# Patient Record
Sex: Female | Born: 1975
Health system: Southern US, Community
[De-identification: ages and names within clinical notes are randomized; demographics above are authoritative.]

## PROBLEM LIST (undated history)

## (undated) DIAGNOSIS — F329 Major depressive disorder, single episode, unspecified: Secondary | ICD-10-CM

## (undated) DIAGNOSIS — F32A Depression, unspecified: Secondary | ICD-10-CM

## (undated) DIAGNOSIS — F419 Anxiety disorder, unspecified: Secondary | ICD-10-CM

## (undated) DIAGNOSIS — F410 Panic disorder [episodic paroxysmal anxiety] without agoraphobia: Secondary | ICD-10-CM

## (undated) HISTORY — PX: CHOLECYSTECTOMY: SHX55

## (undated) HISTORY — PX: TONSILLECTOMY: SUR1361

---

## 2008-10-27 ENCOUNTER — Emergency Department (HOSPITAL_COMMUNITY): Admission: EM | Admit: 2008-10-27 | Discharge: 2008-10-27 | Payer: Self-pay | Admitting: Family Medicine

## 2009-05-23 ENCOUNTER — Emergency Department (HOSPITAL_COMMUNITY): Admission: EM | Admit: 2009-05-23 | Discharge: 2009-05-23 | Payer: Self-pay | Admitting: Family Medicine

## 2010-07-23 ENCOUNTER — Emergency Department (HOSPITAL_COMMUNITY)
Admission: EM | Admit: 2010-07-23 | Discharge: 2010-07-23 | Disposition: A | Discharge status: Home / Self Care | Payer: Self-pay | Attending: Emergency Medicine | Admitting: Emergency Medicine

## 2010-07-23 DIAGNOSIS — K089 Disorder of teeth and supporting structures, unspecified: Secondary | ICD-10-CM | POA: Insufficient documentation

## 2010-08-08 LAB — POCT URINALYSIS DIP (DEVICE)
Bilirubin Urine: NEGATIVE
Nitrite: NEGATIVE
Protein, ur: NEGATIVE mg/dL
Urobilinogen, UA: 1 mg/dL (ref 0.0–1.0)
pH: 7 (ref 5.0–8.0)

## 2012-05-11 ENCOUNTER — Encounter (HOSPITAL_COMMUNITY): Payer: Self-pay | Admitting: Emergency Medicine

## 2012-05-11 ENCOUNTER — Emergency Department (INDEPENDENT_AMBULATORY_CARE_PROVIDER_SITE_OTHER)
Admission: EM | Admit: 2012-05-11 | Discharge: 2012-05-11 | Disposition: A | Discharge status: Home / Self Care | Payer: Worker's Compensation | Source: Home / Self Care | Attending: Emergency Medicine | Admitting: Emergency Medicine

## 2012-05-11 DIAGNOSIS — S76319A Strain of muscle, fascia and tendon of the posterior muscle group at thigh level, unspecified thigh, initial encounter: Secondary | ICD-10-CM

## 2012-05-11 DIAGNOSIS — IMO0002 Reserved for concepts with insufficient information to code with codable children: Secondary | ICD-10-CM

## 2012-05-11 MED ORDER — CYCLOBENZAPRINE HCL 10 MG PO TABS: 10.0000 mg | ORAL_TABLET | Freq: Three times a day (TID) | ORAL | Status: DC | PRN

## 2012-05-11 MED ORDER — HYDROCODONE-IBUPROFEN 7.5-200 MG PO TABS: 1.0000 | ORAL_TABLET | Freq: Three times a day (TID) | ORAL | Status: DC | PRN

## 2012-05-11 NOTE — ED Notes (Signed)
Pt c/o right leg pain. Pt states that she was pushing a clothes hamper to open up door but it stopped her suddenly. Pt states that later through out the day there was a gradual onset of pain in the right leg. Incident happened on 05/04/12. Pt has used ice,heat,and ibuprofen with no relief in pain. Pain is worse with standing and bending.

## 2012-05-11 NOTE — ED Notes (Signed)
Waiting discharge papers 

## 2012-05-11 NOTE — ED Provider Notes (Addendum)
History     CSN: [id number]  Arrival date & time 05/11/12  1621   First MD Initiated Contact with Patient 05/11/12 1622      Chief Complaint  Patient presents with  . Leg Pain    right leg pain. pt was pushing hamper into door but it did not open stopping her suddenly. gradual on set of pain throught the day.     (Consider location/radiation/quality/duration/timing/severity/associated sxs/prior treatment) HPI Comments: Patient presents urgent care this arriving pain on the posterior aspect of her right upper leg. She was pushing a clothes hamper to open a door when it suddenly stopped. She suddenly felt pain on the posterior aspect of her right leg. For the course of the week had been worsening. She has been applying both eyes heat and taking ibuprofen for pain. Pain continues and is exacerbated with standing and walking and when she bends her leg. Denies any bruising, denies any numbness or tingling sensations or lower extremities or weakness.  Patient is a 37 y.o. female presenting with leg pain. The history is provided by the patient.  Leg Pain  The incident occurred more than 2 days ago. The incident occurred at work. The pain is present in the right thigh. The pain is at a severity of 6/10. The pain is moderate. The pain has been constant since onset. Associated symptoms include inability to bear weight. Pertinent negatives include no numbness, no loss of motion, no muscle weakness, no loss of sensation and no tingling. She has tried nothing for the symptoms.    History reviewed. No pertinent past medical history.  Past Surgical History  Procedure Date  . Cholecystectomy   . Tonsillectomy     History reviewed. No pertinent family history.  History  Substance Use Topics  . Smoking status: Never Smoker   . Smokeless tobacco: Not on file  . Alcohol Use: No    OB History    Grav Para Term Preterm Abortions TAB SAB Ect Mult Living                  Review of Systems    Constitutional: Positive for activity change. Negative for fever, chills, diaphoresis, appetite change, fatigue and unexpected weight change.  Cardiovascular: Negative for chest pain.  Musculoskeletal: Negative for myalgias, back pain, joint swelling, arthralgias and gait problem.  Skin: Negative for rash and wound.  Neurological: Negative for tingling, facial asymmetry, weakness and numbness.    Allergies  Review of patient's allergies indicates no known allergies.  Home Medications   Current Outpatient Rx  Name  Route  Sig  Dispense  Refill  . CYCLOBENZAPRINE HCL 10 MG PO TABS   Oral   Take 1 tablet (10 mg total) by mouth 3 (three) times daily as needed for muscle spasms.   15 tablet   0   . HYDROCODONE-IBUPROFEN 7.5-200 MG PO TABS   Oral   Take 1 tablet by mouth every 8 (eight) hours as needed for pain.   15 tablet   0     BP 132/93  Pulse 94  Temp 98.7 F (37.1 C) (Oral)  Resp 20  SpO2 100%  LMP 04/26/2012  Physical Exam  Nursing note and vitals reviewed. Constitutional: Vital signs are normal. She appears well-nourished.  Non-toxic appearance. She does not have a sickly appearance. She does not appear ill. No distress.  Neck: Neck supple.  Musculoskeletal: She exhibits tenderness.       Right upper leg: She exhibits tenderness. She  exhibits no bony tenderness, no swelling, no edema, no deformity and no laceration.       Legs: Neurological: She is alert. She has normal strength. No sensory deficit. She exhibits normal muscle tone.  Skin: No abrasion, no bruising, no ecchymosis, no laceration, no petechiae and no rash noted. No erythema. No pallor.    ED Course  Procedures (including critical care time)  Labs Reviewed - No data to display No results found.   1. Hamstring muscle strain       MDM  Right hamstring sprain/strain. Incident occurred while working. Initially patient refused to followup with occupational health at her job environment.  Presented this evening to urgent care complaining of pain and requesting forms to be filled out. Patient was informed that we would medically attend to her symptoms but she needed to followup with occupational health for documentation purposes and followup. Patient was prescribed 15 tablets of Vicoprofen - along with a muscle relaxer and encouraged to continue applying ice packs. Was also provided with an Ace wrap to provide mild compression to strained area. Patient agrees with treatment plan and followup care. A Dr. Jamelle Haring was provided with restrictions for work for the next 72 hours.      Jimmie Molly, MD 05/11/12 1813  Jimmie Molly, MD 05/11/12 [phone]

## 2014-01-09 ENCOUNTER — Encounter (HOSPITAL_COMMUNITY): Payer: Self-pay | Admitting: Emergency Medicine

## 2014-01-09 ENCOUNTER — Emergency Department (INDEPENDENT_AMBULATORY_CARE_PROVIDER_SITE_OTHER)
Admission: EM | Admit: 2014-01-09 | Discharge: 2014-01-09 | Disposition: A | Discharge status: Home / Self Care | Payer: 59 | Source: Home / Self Care | Attending: Emergency Medicine | Admitting: Emergency Medicine

## 2014-01-09 DIAGNOSIS — R300 Dysuria: Secondary | ICD-10-CM

## 2014-01-09 DIAGNOSIS — N390 Urinary tract infection, site not specified: Secondary | ICD-10-CM

## 2014-01-09 LAB — POCT URINALYSIS DIP (DEVICE)
Bilirubin Urine: NEGATIVE
Glucose, UA: NEGATIVE mg/dL
Ketones, ur: NEGATIVE mg/dL
LEUKOCYTES UA: NEGATIVE
NITRITE: NEGATIVE
PROTEIN: NEGATIVE mg/dL
Specific Gravity, Urine: >=1.03 (ref 1.005–1.030)
UROBILINOGEN UA: 0.2 mg/dL (ref 0.0–1.0)
pH: 5.5 (ref 5.0–8.0)

## 2014-01-09 MED ORDER — NITROFURANTOIN MONOHYD MACRO 100 MG PO CAPS: 100.0000 mg | ORAL_CAPSULE | Freq: Two times a day (BID) | ORAL | Status: DC

## 2014-01-09 NOTE — ED Provider Notes (Signed)
Medical screening examination/treatment/procedure(s) were performed by non-physician practitioner and as supervising physician I was immediately available for consultation/collaboration.  Leslee Home, M.D.  Reuben Likes, MD 01/09/14 2139

## 2014-01-09 NOTE — Discharge Instructions (Signed)
Urinary Tract Infection A urinary tract infection (UTI) can occur any place along the urinary tract. The tract includes the kidneys, ureters, bladder, and urethra. A type of germ called bacteria often causes a UTI. UTIs are often helped with antibiotic medicine.  HOME CARE   If given, take antibiotics as told by your doctor. Finish them even if you start to feel better.  Drink enough fluids to keep your pee (urine) clear or pale yellow.  Avoid tea, drinks with caffeine, and bubbly (carbonated) drinks.  Pee often. Avoid holding your pee in for a long time.  Pee before and after having sex (intercourse).  Wipe from front to back after you poop (bowel movement) if you are a woman. Use each tissue only once. GET HELP RIGHT AWAY IF:   You have back pain.  You have lower belly (abdominal) pain.  You have chills.  You feel sick to your stomach (nauseous).  You throw up (vomit).  Your burning or discomfort with peeing does not go away.  You have a fever.  Your symptoms are not better in 3 days. MAKE SURE YOU:   Understand these instructions.  Will watch your condition.  Will get help right away if you are not doing well or get worse. Document Released: 10/04/2007 Document Revised: 01/10/2012 Document Reviewed: 11/16/2011 [facility] Patient Information 2015 [place], [place]. This information is not intended to replace advice given to you by your health care provider. Make sure you discuss any questions you have with your health care provider.   Lots of water. Empty bladder post intercourse. AZO early can help with symptoms. F/U if worsens.

## 2014-01-09 NOTE — ED Provider Notes (Signed)
CSN: [id number]     Arrival date & time 01/09/14  1837 History   First MD Initiated Contact with Patient 01/09/14 1903     Chief Complaint  Patient presents with  . Urinary Tract Infection   (Consider location/radiation/quality/duration/timing/severity/associated sxs/prior Treatment) HPI Comments: Patient presents with dysuria, frequency and pressure for 24-36 hours. Prior UTI's in the past and this feels the same. She is in a monogamous relationship, no vaginal discharge. No missed menses. No abdominal or pelvic pain. No fever or chills. Recent PAP and STD check at GYN in last 4 months; all normal  Patient is a 38 y.o. female presenting with urinary tract infection. The history is provided by the patient.  Urinary Tract Infection    History reviewed. No pertinent past medical history. Past Surgical History  Procedure Laterality Date  . Cholecystectomy    . Tonsillectomy     No family history on file. History  Substance Use Topics  . Smoking status: Never Smoker   . Smokeless tobacco: Not on file  . Alcohol Use: No   OB History   Grav Para Term Preterm Abortions TAB SAB Ect Mult Living                 Review of Systems  All other systems reviewed and are negative.   Allergies  Review of patient's allergies indicates no known allergies.  Home Medications   Prior to Admission medications   Medication Sig Start Date End Date Taking? Authorizing Provider  cyclobenzaprine (FLEXERIL) 10 MG tablet Take 1 tablet (10 mg total) by mouth 3 (three) times daily as needed for muscle spasms. 05/11/12   Jimmie Molly, MD  HYDROcodone-ibuprofen (VICOPROFEN) 7.5-200 MG per tablet Take 1 tablet by mouth every 8 (eight) hours as needed for pain. 05/11/12   Jimmie Molly, MD  nitrofurantoin, macrocrystal-monohydrate, (MACROBID) 100 MG capsule Take 1 capsule (100 mg total) by mouth 2 (two) times daily. 01/09/14   Riki Sheer, PA-C   BP 138/83  Pulse 95  Temp(Src) 98.8 F (37.1 C) (Oral)   Resp 16  SpO2 97% Physical Exam  Nursing note and vitals reviewed. Constitutional: She is oriented to person, place, and time. She appears well-developed and well-nourished. No distress.  HENT:  Head: Normocephalic and atraumatic.  Abdominal: Soft. She exhibits no distension and no mass. There is no tenderness. There is no rebound and no guarding.  Neurological: She is alert and oriented to person, place, and time.  Skin: Skin is warm and dry. She is not diaphoretic.  Psychiatric: Her behavior is normal.    ED Course  Procedures (including critical care time) Labs Review Labs Reviewed  POCT URINALYSIS DIP (DEVICE) - Abnormal; Notable for the following:    Hgb urine dipstick MODERATE (*)    All other components within normal limits    Imaging Review No results found.   MDM   1. UTI (lower urinary tract infection)   2. Dysuria    Treat symptoms as appears to be UTI. Noted hematuria. Cover with Macrobid. F/U if worsens.     Riki Sheer, PA-C 01/09/14 1928

## 2014-01-09 NOTE — ED Notes (Signed)
Patient c/o frquent urination and urgency to urinate onset today. Patient reports she does hold her urine and wear thongs and drink a lot of soda but she doesn't know if that attributes to the symptoms. Patient denies fever. Patient is alert and oriented and in NAD.

## 2014-04-08 ENCOUNTER — Emergency Department (INDEPENDENT_AMBULATORY_CARE_PROVIDER_SITE_OTHER)
Admission: EM | Admit: 2014-04-08 | Discharge: 2014-04-08 | Disposition: A | Discharge status: Home / Self Care | Payer: 59 | Source: Home / Self Care | Attending: Family Medicine | Admitting: Family Medicine

## 2014-04-08 ENCOUNTER — Encounter (HOSPITAL_COMMUNITY): Payer: Self-pay | Admitting: *Deleted

## 2014-04-08 DIAGNOSIS — J40 Bronchitis, not specified as acute or chronic: Secondary | ICD-10-CM

## 2014-04-08 MED ORDER — AZITHROMYCIN 250 MG PO TABS: ORAL_TABLET | ORAL | Status: DC

## 2014-04-08 MED ORDER — HYDROCOD POLST-CHLORPHEN POLST 10-8 MG/5ML PO LQCR: 5.0000 mL | Freq: Two times a day (BID) | ORAL | Status: DC | PRN

## 2014-04-08 MED ORDER — ALBUTEROL SULFATE HFA 108 (90 BASE) MCG/ACT IN AERS: 2.0000 | INHALATION_SPRAY | RESPIRATORY_TRACT | Status: DC | PRN

## 2014-04-08 NOTE — ED Provider Notes (Signed)
CSN: [id number]     Arrival date & time 04/08/14  1853 History   First MD Initiated Contact with Patient 04/08/14 1942     No chief complaint on file.  (Consider location/radiation/quality/duration/timing/severity/associated sxs/prior Treatment) HPI             38 year old female presents complaining of a sinus infection or bronchitis. She has been sick for about 10 days. She has cough, congestion, sinus pressure, sore throat. She also feels slightly short of breath. Her symptoms have been constantly worsening since they began. She denies fever, chills, NVD, or abdominal pain. No recent travel or sick contacts.  No past medical history on file. Past Surgical History  Procedure Laterality Date  . Cholecystectomy    . Tonsillectomy     No family history on file. History  Substance Use Topics  . Smoking status: Never Smoker   . Smokeless tobacco: Not on file  . Alcohol Use: No   OB History    No data available     Review of Systems  Constitutional: Negative for fever and chills.  HENT: Positive for congestion, ear pain, postnasal drip, rhinorrhea, sinus pressure and sore throat.   Respiratory: Positive for cough. Negative for shortness of breath.   Cardiovascular: Negative for chest pain.  Gastrointestinal: Negative for nausea, vomiting, abdominal pain and diarrhea.  All other systems reviewed and are negative.   Allergies  Review of patient's allergies indicates no known allergies.  Home Medications   Prior to Admission medications   Medication Sig Start Date End Date Taking? Authorizing Provider  albuterol (PROVENTIL HFA;VENTOLIN HFA) 108 (90 BASE) MCG/ACT inhaler Inhale 2 puffs into the lungs every 4 (four) hours as needed for wheezing. 04/08/14   Graylon GoodZachary H Lyann Hagstrom, PA-C  azithromycin (ZITHROMAX Z-PAK) 250 MG tablet Use as directed 04/08/14   Graylon GoodZachary H Shameca Landen, PA-C  chlorpheniramine-HYDROcodone (TUSSIONEX PENNKINETIC ER) 10-8 MG/5ML LQCR Take 5 mLs by mouth every 12 (twelve)  hours as needed for cough. 04/08/14   Adrian BlackwaterZachary H Brystol Wasilewski, PA-C  cyclobenzaprine (FLEXERIL) 10 MG tablet Take 1 tablet (10 mg total) by mouth 3 (three) times daily as needed for muscle spasms. 05/11/12   Jimmie MollyPaolo Coll, MD  HYDROcodone-ibuprofen (VICOPROFEN) 7.5-200 MG per tablet Take 1 tablet by mouth every 8 (eight) hours as needed for pain. 05/11/12   Jimmie MollyPaolo Coll, MD  nitrofurantoin, macrocrystal-monohydrate, (MACROBID) 100 MG capsule Take 1 capsule (100 mg total) by mouth 2 (two) times daily. 01/09/14   Riki SheerMichelle G Young, PA-C   BP 133/65 mmHg  Pulse 90  Temp(Src) 98.1 F (36.7 C) (Oral)  Resp 16  SpO2 98% Physical Exam  Constitutional: She is oriented to person, place, and time. Vital signs are normal. She appears well-developed and well-nourished. No distress.  HENT:  Head: Normocephalic and atraumatic.  Right Ear: External ear normal.  Left Ear: External ear normal.  Nose: Nose normal.  Mouth/Throat: Oropharynx is clear and moist. No oropharyngeal exudate.  Eyes: Conjunctivae are normal. Right eye exhibits no discharge. Left eye exhibits no discharge.  Neck: Normal range of motion. Neck supple.  Cardiovascular: Normal rate, regular rhythm and normal heart sounds.   Pulmonary/Chest: Effort normal. No respiratory distress. She has wheezes (mild expiratory, bilateral upper lung fields). She has no rales.  Lymphadenopathy:    She has no cervical adenopathy.  Neurological: She is alert and oriented to person, place, and time. She has normal strength. Coordination normal.  Skin: Skin is warm and dry. No rash noted. She is not diaphoretic.  Psychiatric: She has a normal mood and affect. Judgment normal.  Nursing note and vitals reviewed.   ED Course  Procedures (including critical care time) Labs Review Labs Reviewed - No data to display  Imaging Review No results found.   MDM   1. Bronchitis    Bronchitis, so worsening after 10 days, will treat with azithromycin in addition to  symptomatic management with cough suppressant and albuterol. Follow-up if no improvement in a few days.   Meds ordered this encounter  Medications  . azithromycin (ZITHROMAX Z-PAK) 250 MG tablet    Sig: Use as directed    Dispense:  6 each    Refill:  0    Order Specific Question:  Supervising Provider    Answer:  Linna HoffKINDL, JAMES D [phone][[id number]]  . chlorpheniramine-HYDROcodone (TUSSIONEX PENNKINETIC ER) 10-8 MG/5ML LQCR    Sig: Take 5 mLs by mouth every 12 (twelve) hours as needed for cough.    Dispense:  115 mL    Refill:  0    Order Specific Question:  Supervising Provider    Answer:  Linna HoffKINDL, JAMES D [phone][[id number]]  . albuterol (PROVENTIL HFA;VENTOLIN HFA) 108 (90 BASE) MCG/ACT inhaler    Sig: Inhale 2 puffs into the lungs every 4 (four) hours as needed for wheezing.    Dispense:  1 Inhaler    Refill:  0    Order Specific Question:  Supervising Provider    Answer:  Bradd CanaryKINDL, JAMES D [[id number]]   \    Graylon GoodZachary H Jeriah Corkum, PA-C 04/08/14 2011

## 2014-04-08 NOTE — ED Notes (Signed)
C/o coughing up mucous, pressure in her sinuses, and sore throat onset 7-10 days ago..  No fever or earache.

## 2014-04-08 NOTE — Discharge Instructions (Signed)
Metered Dose Inhaler (No Spacer Used) Inhaled medicines are the basis of treatment for asthma and other breathing problems. Inhaled medicine can only be effective if used properly. Good technique assures that the medicine reaches the lungs. Metered dose inhalers (MDIs) are used to deliver a variety of inhaled medicines. These include quick relief or rescue medicines (such as bronchodilators) and controller medicines (such as corticosteroids). The medicine is delivered by pushing down on a metal canister to release a set amount of spray. If you are using different kinds of inhalers, use your quick relief medicine to open the airways 10-15 minutes before using a steroid, if instructed to do so by your health care provider. If you are unsure which inhalers to use and the order of using them, ask your health care provider, nurse, or respiratory therapist. HOW TO USE THE INHALER  Remove the cap from the inhaler.  If you are using the inhaler for the first time, you will need to prime it. Shake the inhaler for 5 seconds and release four puffs into the air, away from your face. Ask your health care provider or pharmacist if you have questions about priming your inhaler.  Shake the inhaler for 5 seconds before each breath in (inhalation).  Position the inhaler so that the top of the canister faces up.  Put your index finger on the top of the medicine canister. Your thumb supports the bottom of the inhaler.  Open your mouth.  Either place the inhaler between your teeth and place your lips tightly around the mouthpiece, or hold the inhaler 1-2 inches away from your open mouth. If you are unsure of which technique to use, ask your health care provider.  Breathe out (exhale) normally and as completely as possible.  Press the canister down with the index finger to release the medicine.  At the same time as the canister is pressed, inhale deeply and slowly until your lungs are completely filled. This  should take 4-6 seconds. Keep your tongue down.  Hold the medicine in your lungs for 5-10 seconds (10 seconds is best). This helps the medicine get into the small airways of your lungs.  Breathe out slowly, through pursed lips. Whistling is an example of pursed lips.  Wait at least 1 minute between puffs. Continue with the above steps until you have taken the number of puffs your health care provider has ordered. Do not use the inhaler more than your health care provider directs you to.  Replace the cap on the inhaler.  Follow the directions from your health care provider or the inhaler insert for cleaning the inhaler. If you are using a steroid inhaler, after your last puff, rinse your mouth with water, gargle, and spit out the water. Do not swallow the water. AVOID:  Inhaling before or after starting the spray of medicine. It takes practice to coordinate your breathing with triggering the spray.  Inhaling through the nose (rather than the mouth) when triggering the spray. HOW TO DETERMINE IF YOUR INHALER IS FULL OR NEARLY EMPTY You cannot know when an inhaler is empty by shaking it. Some inhalers are now being made with dose counters. Ask your health care provider for a prescription that has a dose counter if you feel you need that extra help. If your inhaler does not have a counter, ask your health care provider to help you determine the date you need to refill your inhaler. Write the refill date on a calendar or your inhaler canister. Refill  your inhaler 7-10 days before it runs out. Be sure to keep an adequate supply of medicine. This includes making sure it has not expired, and making sure you have a spare inhaler. SEEK MEDICAL CARE IF:  Symptoms are only partially relieved with your inhaler.  You are having trouble using your inhaler.  You experience an increase in phlegm. SEEK IMMEDIATE MEDICAL CARE IF:  You feel little or no relief with your inhalers. You are still wheezing and  feeling shortness of breath, tightness in your chest, or both.  You have dizziness, headaches, or a fast heart rate.  You have chills, fever, or night sweats.  There is a noticeable increase in phlegm production, or there is blood in the phlegm. MAKE SURE YOU:  Understand these instructions.  Will watch your condition.  Will get help right away if you are not doing well or get worse. Document Released: 02/12/2007 Document Revised: 09/01/2013 Document Reviewed: 10/03/2012 [facility] Patient Information 2015 [place], [place]. This information is not intended to replace advice given to you by your health care provider. Make sure you discuss any questions you have with your health care provider.  Upper Respiratory Infection, Adult An upper respiratory infection (URI) is also sometimes known as the common cold. The upper respiratory tract includes the nose, sinuses, throat, trachea, and bronchi. Bronchi are the airways leading to the lungs. Most people improve within 1 week, but symptoms can last up to 2 weeks. A residual cough may last even longer.  CAUSES Many different viruses can infect the tissues lining the upper respiratory tract. The tissues become irritated and inflamed and often become very moist. Mucus production is also common. A cold is contagious. You can easily spread the virus to others by oral contact. This includes kissing, sharing a glass, coughing, or sneezing. Touching your mouth or nose and then touching a surface, which is then touched by another person, can also spread the virus. SYMPTOMS  Symptoms typically develop 1 to 3 days after you come in contact with a cold virus. Symptoms vary from person to person. They may include:  Runny nose.  Sneezing.  Nasal congestion.  Sinus irritation.  Sore throat.  Loss of voice (laryngitis).  Cough.  Fatigue.  Muscle aches.  Loss of appetite.  Headache.  Low-grade fever. DIAGNOSIS  You might diagnose your own cold  based on familiar symptoms, since most people get a cold 2 to 3 times a year. Your caregiver can confirm this based on your exam. Most importantly, your caregiver can check that your symptoms are not due to another disease such as strep throat, sinusitis, pneumonia, asthma, or epiglottitis. Blood tests, throat tests, and X-rays are not necessary to diagnose a common cold, but they may sometimes be helpful in excluding other more serious diseases. Your caregiver will decide if any further tests are required. RISKS AND COMPLICATIONS  You may be at risk for a more severe case of the common cold if you smoke cigarettes, have chronic heart disease (such as heart failure) or lung disease (such as asthma), or if you have a weakened immune system. The very young and very old are also at risk for more serious infections. Bacterial sinusitis, middle ear infections, and bacterial pneumonia can complicate the common cold. The common cold can worsen asthma and chronic obstructive pulmonary disease (COPD). Sometimes, these complications can require emergency medical care and may be life-threatening. PREVENTION  The best way to protect against getting a cold is to practice good hygiene. Avoid  oral or hand contact with people with cold symptoms. Wash your hands often if contact occurs. There is no clear evidence that vitamin C, vitamin E, echinacea, or exercise reduces the chance of developing a cold. However, it is always recommended to get plenty of rest and practice good nutrition. TREATMENT  Treatment is directed at relieving symptoms. There is no cure. Antibiotics are not effective, because the infection is caused by a virus, not by bacteria. Treatment may include:  Increased fluid intake. Sports drinks offer valuable electrolytes, sugars, and fluids.  Breathing heated mist or steam (vaporizer or shower).  Eating chicken soup or other clear broths, and maintaining good nutrition.  Getting plenty of rest.  Using  gargles or lozenges for comfort.  Controlling fevers with ibuprofen or acetaminophen as directed by your caregiver.  Increasing usage of your inhaler if you have asthma. Zinc gel and zinc lozenges, taken in the first 24 hours of the common cold, can shorten the duration and lessen the severity of symptoms. Pain medicines may help with fever, muscle aches, and throat pain. A variety of non-prescription medicines are available to treat congestion and runny nose. Your caregiver can make recommendations and may suggest nasal or lung inhalers for other symptoms.  HOME CARE INSTRUCTIONS   Only take over-the-counter or prescription medicines for pain, discomfort, or fever as directed by your caregiver.  Use a warm mist humidifier or inhale steam from a shower to increase air moisture. This may keep secretions moist and make it easier to breathe.  Drink enough water and fluids to keep your urine clear or pale yellow.  Rest as needed.  Return to work when your temperature has returned to normal or as your caregiver advises. You may need to stay home longer to avoid infecting others. You can also use a face mask and careful hand washing to prevent spread of the virus. SEEK MEDICAL CARE IF:   After the first few days, you feel you are getting worse rather than better.  You need your caregiver's advice about medicines to control symptoms.  You develop chills, worsening shortness of breath, or brown or red sputum. These may be signs of pneumonia.  You develop yellow or brown nasal discharge or pain in the face, especially when you bend forward. These may be signs of sinusitis.  You develop a fever, swollen neck glands, pain with swallowing, or white areas in the back of your throat. These may be signs of strep throat. SEEK IMMEDIATE MEDICAL CARE IF:   You have a fever.  You develop severe or persistent headache, ear pain, sinus pain, or chest pain.  You develop wheezing, a prolonged cough, cough  up blood, or have a change in your usual mucus (if you have chronic lung disease).  You develop sore muscles or a stiff neck. Document Released: 10/11/2000 Document Revised: 07/10/2011 Document Reviewed: 07/23/2013 [facility] Patient Information 2015 [place]Exit[place], [place]. This information is not intended to replace advice given to you by your health care provider. Make sure you discuss any questions you have with your health care provider.

## 2014-11-06 ENCOUNTER — Emergency Department (INDEPENDENT_AMBULATORY_CARE_PROVIDER_SITE_OTHER)
Admission: EM | Admit: 2014-11-06 | Discharge: 2014-11-06 | Disposition: A | Discharge status: Home / Self Care | Payer: 59 | Source: Home / Self Care

## 2014-11-06 ENCOUNTER — Encounter (HOSPITAL_COMMUNITY): Payer: Self-pay

## 2014-11-06 DIAGNOSIS — L237 Allergic contact dermatitis due to plants, except food: Secondary | ICD-10-CM

## 2014-11-06 MED ORDER — PREDNISONE 20 MG PO TABS: ORAL_TABLET | ORAL | Status: DC

## 2014-11-06 NOTE — ED Notes (Signed)
Rash on skin. ?poison ivy?

## 2014-11-06 NOTE — Discharge Instructions (Signed)

## 2014-11-06 NOTE — ED Provider Notes (Signed)
CSN: [id number]     Arrival date & time 11/06/14  1800 History   First MD Initiated Contact with Patient 11/06/14 1817     No chief complaint on file.  (Consider location/radiation/quality/duration/timing/severity/associated sxs/prior Treatment) Patient is a 39 y.o. female presenting with poison ivy. The history is provided by the patient.  Poison Kristin Guzman This is a new problem. The current episode started more than 1 week ago. The problem occurs constantly. The problem has been gradually worsening. Nothing aggravates the symptoms. The symptoms are relieved by medications. She has tried a cold compress and a warm compress for the symptoms. The treatment provided no relief.   Was weeding in yard one week ago and developed pruritc rash on arms, upper chest, and legs No past medical history on file. Past Surgical History  Procedure Laterality Date  . Cholecystectomy    . Tonsillectomy     No family history on file. History  Substance Use Topics  . Smoking status: Never Smoker   . Smokeless tobacco: Not on file  . Alcohol Use: Yes     Comment: social   OB History    No data available     Review of Systems  Constitutional: Negative.   HENT: Negative.   Eyes: Negative.   Respiratory: Negative.   Cardiovascular: Negative.   Gastrointestinal: Negative.   Skin: Positive for rash.  Neurological: Negative.   Psychiatric/Behavioral: Negative.     Allergies  Review of patient's allergies indicates no known allergies.  Home Medications   Prior to Admission medications   Medication Sig Start Date End Date Taking? Authorizing Provider  albuterol (PROVENTIL HFA;VENTOLIN HFA) 108 (90 BASE) MCG/ACT inhaler Inhale 2 puffs into the lungs every 4 (four) hours as needed for wheezing. 04/08/14   Graylon GoodZachary H Baker, PA-C  azithromycin (ZITHROMAX Z-PAK) 250 MG tablet Use as directed 04/08/14   Graylon GoodZachary H Baker, PA-C  chlorpheniramine-HYDROcodone (TUSSIONEX PENNKINETIC ER) 10-8 MG/5ML LQCR Take 5 mLs by  mouth every 12 (twelve) hours as needed for cough. 04/08/14   Adrian BlackwaterZachary H Baker, PA-C  cyclobenzaprine (FLEXERIL) 10 MG tablet Take 1 tablet (10 mg total) by mouth 3 (three) times daily as needed for muscle spasms. 05/11/12   Jimmie MollyPaolo Coll, MD  HYDROcodone-ibuprofen (VICOPROFEN) 7.5-200 MG per tablet Take 1 tablet by mouth every 8 (eight) hours as needed for pain. 05/11/12   Jimmie MollyPaolo Coll, MD  nitrofurantoin, macrocrystal-monohydrate, (MACROBID) 100 MG capsule Take 1 capsule (100 mg total) by mouth 2 (two) times daily. 01/09/14   Riki SheerMichelle G Young, PA-C   BP 127/85 mmHg  Pulse 93  Temp(Src) 98.5 F (36.9 C) (Oral)  Resp 16  SpO2 100% Physical Exam  Constitutional: She appears well-developed and well-nourished.  Skin: Rash noted.  Psychiatric:  Linear streaks on left forearm, papules on upper chest No hand web space lesions. No honey crusting    ED Course  Procedures (including critical care time) Labs Review Labs Reviewed - No data to display  Imaging Review No results found.   MDM  Most likely diffuse poison ivy.  Will use prednisone.  [place] to [place] off there.  Camelia EngKurt Sydney Hasten,MD    Chi Garlow, MD 11/06/14 [phone]

## 2015-01-10 ENCOUNTER — Encounter (HOSPITAL_BASED_OUTPATIENT_CLINIC_OR_DEPARTMENT_OTHER): Payer: Self-pay | Admitting: Emergency Medicine

## 2015-01-10 ENCOUNTER — Emergency Department (HOSPITAL_BASED_OUTPATIENT_CLINIC_OR_DEPARTMENT_OTHER)
Admission: EM | Admit: 2015-01-10 | Discharge: 2015-01-10 | Disposition: A | Discharge status: Home / Self Care | Payer: 59 | Attending: Emergency Medicine | Admitting: Emergency Medicine

## 2015-01-10 DIAGNOSIS — Z79899 Other long term (current) drug therapy: Secondary | ICD-10-CM | POA: Insufficient documentation

## 2015-01-10 DIAGNOSIS — K029 Dental caries, unspecified: Secondary | ICD-10-CM

## 2015-01-10 DIAGNOSIS — K0261 Dental caries on smooth surface limited to enamel: Secondary | ICD-10-CM | POA: Diagnosis not present

## 2015-01-10 DIAGNOSIS — K002 Abnormalities of size and form of teeth: Secondary | ICD-10-CM | POA: Diagnosis not present

## 2015-01-10 DIAGNOSIS — K088 Other specified disorders of teeth and supporting structures: Secondary | ICD-10-CM | POA: Diagnosis present

## 2015-01-10 MED ORDER — MELOXICAM 15 MG PO TABS: 15.0000 mg | ORAL_TABLET | Freq: Every day | ORAL | Status: DC

## 2015-01-10 MED ORDER — PENICILLIN V POTASSIUM 500 MG PO TABS: 500.0000 mg | ORAL_TABLET | Freq: Four times a day (QID) | ORAL | Status: AC

## 2015-01-10 MED ORDER — PENICILLIN V POTASSIUM 250 MG PO TABS
500.0000 mg | ORAL_TABLET | Freq: Once | ORAL | Status: AC
  Administered 2015-01-10: 500 mg via ORAL
  Filled 2015-01-10: qty 2

## 2015-01-10 NOTE — ED Provider Notes (Signed)
CSN: [id number]     Arrival date & time 01/10/15  0116 History   First MD Initiated Contact with Patient 01/10/15 0319     Chief Complaint  Patient presents with  . Dental Pain     (Consider location/radiation/quality/duration/timing/severity/associated sxs/prior Treatment) Patient is a 39 y.o. female presenting with tooth pain. The history is provided by the patient.  Dental Pain Location:  Lower Lower teeth location:  19/LL 1st molar Quality:  Aching Severity:  Severe Onset quality:  Gradual Timing:  Constant Progression:  Unchanged Chronicity:  Recurrent Context: dental caries and poor dentition   Previous work-up:  Filled cavity and root canal Relieved by:  Nothing Worsened by:  Nothing tried Ineffective treatments:  None tried Associated symptoms: no drooling, no facial swelling, no fever, no neck swelling and no trismus     History reviewed. No pertinent past medical history. Past Surgical History  Procedure Laterality Date  . Cholecystectomy    . Tonsillectomy     History reviewed. No pertinent family history. Social History  Substance Use Topics  . Smoking status: Never Smoker   . Smokeless tobacco: None  . Alcohol Use: Yes     Comment: social   OB History    No data available     Review of Systems  Constitutional: Negative for fever.  HENT: Negative for drooling, facial swelling and trouble swallowing.   All other systems reviewed and are negative.     Allergies  Review of patient's allergies indicates no known allergies.  Home Medications   Prior to Admission medications   Medication Sig Start Date End Date Taking? Authorizing Provider  albuterol (PROVENTIL HFA;VENTOLIN HFA) 108 (90 BASE) MCG/ACT inhaler Inhale 2 puffs into the lungs every 4 (four) hours as needed for wheezing. 04/08/14   Graylon Good, PA-C  azithromycin (ZITHROMAX Z-PAK) 250 MG tablet Use as directed 04/08/14   Graylon Good, PA-C  chlorpheniramine-HYDROcodone (TUSSIONEX  PENNKINETIC ER) 10-8 MG/5ML LQCR Take 5 mLs by mouth every 12 (twelve) hours as needed for cough. 04/08/14   Adrian Blackwater Baker, PA-C  cyclobenzaprine (FLEXERIL) 10 MG tablet Take 1 tablet (10 mg total) by mouth 3 (three) times daily as needed for muscle spasms. 05/11/12   Jimmie Molly, MD  HYDROcodone-ibuprofen (VICOPROFEN) 7.5-200 MG per tablet Take 1 tablet by mouth every 8 (eight) hours as needed for pain. 05/11/12   Jimmie Molly, MD  meloxicam (MOBIC) 15 MG tablet Take 1 tablet (15 mg total) by mouth daily. 01/10/15   Jahkai Yandell, MD  nitrofurantoin, macrocrystal-monohydrate, (MACROBID) 100 MG capsule Take 1 capsule (100 mg total) by mouth 2 (two) times daily. 01/09/14   Riki Sheer, PA-C  penicillin v potassium (VEETID) 500 MG tablet Take 1 tablet (500 mg total) by mouth 4 (four) times daily. 01/10/15 01/17/15  Findlay Dagher, MD  predniSONE (DELTASONE) 20 MG tablet Two daily with food 11/06/14   Elvina Sidle, MD  predniSONE (DELTASONE) 20 MG tablet Two daily with food 11/06/14   Elvina Sidle, MD   BP 146/84 mmHg  Pulse 79  Temp(Src) 98.7 F (37.1 C) (Oral)  Resp 16  Ht 4\' 11"  (1.499 m)  Wt 135 lb (61.236 kg)  BMI 27.25 kg/m2  SpO2 98%  LMP 01/03/2015 Physical Exam  Constitutional: She is oriented to person, place, and time. She appears well-developed and well-nourished. No distress.  HENT:  Head: Normocephalic and atraumatic.  Mouth/Throat: Oropharynx is clear and moist. No trismus in the jaw. Abnormal dentition. Dental caries present.  No uvula swelling.  Eyes: Conjunctivae are normal. Pupils are equal, round, and reactive to light.  Neck: Normal range of motion. Neck supple.  Cardiovascular: Normal rate and regular rhythm.   Pulmonary/Chest: Effort normal and breath sounds normal. She has no wheezes. She has no rales.  Abdominal: Soft. Bowel sounds are normal. There is no tenderness. There is no rebound and no guarding.  Musculoskeletal: Normal range of motion.  Lymphadenopathy:     She has no cervical adenopathy.  Neurological: She is alert and oriented to person, place, and time.  Skin: Skin is warm and dry.  Psychiatric: She has a normal mood and affect.    ED Course  Procedures (including critical care time) Labs Review Labs Reviewed - No data to display  Imaging Review No results found. I have personally reviewed and evaluated these images and lab results as part of my medical decision-making.   EKG Interpretation None      MDM   Final diagnoses:  Dental caries associated with enamel hypomineralization      Medication List    TAKE these medications        meloxicam 15 MG tablet  Commonly known as:  MOBIC  Take 1 tablet (15 mg total) by mouth daily.     penicillin v potassium 500 MG tablet  Commonly known as:  VEETID  Take 1 tablet (500 mg total) by mouth 4 (four) times daily.      ASK your doctor about these medications        albuterol 108 (90 BASE) MCG/ACT inhaler  Commonly known as:  PROVENTIL HFA;VENTOLIN HFA  Inhale 2 puffs into the lungs every 4 (four) hours as needed for wheezing.     azithromycin 250 MG tablet  Commonly known as:  ZITHROMAX Z-PAK  Use as directed     chlorpheniramine-HYDROcodone 10-8 MG/5ML Lqcr  Commonly known as:  TUSSIONEX PENNKINETIC ER  Take 5 mLs by mouth every 12 (twelve) hours as needed for cough.     cyclobenzaprine 10 MG tablet  Commonly known as:  FLEXERIL  Take 1 tablet (10 mg total) by mouth 3 (three) times daily as needed for muscle spasms.     HYDROcodone-ibuprofen 7.5-200 MG per tablet  Commonly known as:  VICOPROFEN  Take 1 tablet by mouth every 8 (eight) hours as needed for pain.     nitrofurantoin (macrocrystal-monohydrate) 100 MG capsule  Commonly known as:  MACROBID  Take 1 capsule (100 mg total) by mouth 2 (two) times daily.     predniSONE 20 MG tablet  Commonly known as:  DELTASONE  Two daily with food     predniSONE 20 MG tablet  Commonly known as:  DELTASONE  Two daily  with food       Referral to dentistry and pen-VK and mobic.  Strict return precautions    Karah Caruthers, MD 01/10/15 0730

## 2015-01-10 NOTE — ED Notes (Signed)
Pt c/o dental pain in left lower molar beginning yesterday. Reports taking Tylenol at home with no relief. States "the tooth is dead".

## 2015-01-10 NOTE — ED Notes (Signed)
Patient reports that she thinks she has an abscess tooth on the left side of her mouth

## 2015-01-10 NOTE — Discharge Instructions (Signed)
Dental Care and Dentist Visits  Dental care supports good overall health. Regular dental visits can also help you avoid dental pain, bleeding, infection, and other more serious health problems in the future. It is important to keep the mouth healthy because diseases in the teeth, gums, and other oral tissues can spread to other areas of the body. Some problems, such as diabetes, heart disease, and pre-term labor have been associated with poor oral health.   See your dentist every 6 months. If you experience emergency problems such as a toothache or broken tooth, go to the dentist right away. If you see your dentist regularly, you may catch problems early. It is easier to be treated for problems in the early stages.   WHAT TO EXPECT AT A DENTIST VISIT   Your dentist will look for many common oral health problems and recommend proper treatment. At your regular dental visit, you can expect:  · Gentle cleaning of the teeth and gums. This includes scraping and polishing. This helps to remove the sticky substance around the teeth and gums (plaque). Plaque forms in the mouth shortly after eating. Over time, plaque hardens on the teeth as tartar. If tartar is not removed regularly, it can cause problems. Cleaning also helps remove stains.  · Periodic X-rays. These pictures of the teeth and supporting bone will help your dentist assess the health of your teeth.  · Periodic fluoride treatments. Fluoride is a natural mineral shown to help strengthen teeth. Fluoride treatment involves applying a fluoride gel or varnish to the teeth. It is most commonly done in children.  · Examination of the mouth, tongue, jaws, teeth, and gums to look for any oral health problems, such as:  ¨ Cavities (dental caries). This is decay on the tooth caused by plaque, sugar, and acid in the mouth. It is best to catch a cavity when it is small.  ¨ Inflammation of the gums caused by plaque buildup (gingivitis).  ¨ Problems with the mouth or malformed  or misaligned teeth.  ¨ Oral cancer or other diseases of the soft tissues or jaws.   KEEP YOUR TEETH AND GUMS HEALTHY  For healthy teeth and gums, follow these general guidelines as well as your dentist's specific advice:  · Have your teeth professionally cleaned at the dentist every 6 months.  · Brush twice daily with a fluoride toothpaste.  · Floss your teeth daily.   · Ask your dentist if you need fluoride supplements, treatments, or fluoride toothpaste.  · Eat a healthy diet. Reduce foods and drinks with added sugar.  · Avoid smoking.  TREATMENT FOR ORAL HEALTH PROBLEMS  If you have oral health problems, treatment varies depending on the conditions present in your teeth and gums.  · Your caregiver will most likely recommend good oral hygiene at each visit.  · For cavities, gingivitis, or other oral health disease, your caregiver will perform a procedure to treat the problem. This is typically done at a separate appointment. Sometimes your caregiver will refer you to another dental specialist for specific tooth problems or for surgery.  SEEK IMMEDIATE DENTAL CARE IF:  · You have pain, bleeding, or soreness in the gum, tooth, jaw, or mouth area.  · A permanent tooth becomes loose or separated from the gum socket.  · You experience a blow or injury to the mouth or jaw area.  Document Released: 12/28/2010 Document Revised: 07/10/2011 Document Reviewed: 12/28/2010  ExitCare® Patient Information ©2015 [employer]. This information is not intended to replace advice   given to you by your health care provider. Make sure you discuss any questions you have with your health care provider.

## 2015-05-03 ENCOUNTER — Emergency Department (HOSPITAL_BASED_OUTPATIENT_CLINIC_OR_DEPARTMENT_OTHER)
Admission: EM | Admit: 2015-05-03 | Discharge: 2015-05-03 | Disposition: A | Discharge status: Home / Self Care | Payer: 59 | Attending: Emergency Medicine | Admitting: Emergency Medicine

## 2015-05-03 ENCOUNTER — Encounter (HOSPITAL_BASED_OUTPATIENT_CLINIC_OR_DEPARTMENT_OTHER): Payer: Self-pay | Admitting: *Deleted

## 2015-05-03 DIAGNOSIS — Y9289 Other specified places as the place of occurrence of the external cause: Secondary | ICD-10-CM | POA: Diagnosis not present

## 2015-05-03 DIAGNOSIS — Z79899 Other long term (current) drug therapy: Secondary | ICD-10-CM | POA: Insufficient documentation

## 2015-05-03 DIAGNOSIS — Y9389 Activity, other specified: Secondary | ICD-10-CM | POA: Diagnosis not present

## 2015-05-03 DIAGNOSIS — X58XXXA Exposure to other specified factors, initial encounter: Secondary | ICD-10-CM | POA: Insufficient documentation

## 2015-05-03 DIAGNOSIS — Y998 Other external cause status: Secondary | ICD-10-CM | POA: Insufficient documentation

## 2015-05-03 DIAGNOSIS — M76891 Other specified enthesopathies of right lower limb, excluding foot: Secondary | ICD-10-CM

## 2015-05-03 DIAGNOSIS — S76311A Strain of muscle, fascia and tendon of the posterior muscle group at thigh level, right thigh, initial encounter: Secondary | ICD-10-CM | POA: Diagnosis not present

## 2015-05-03 DIAGNOSIS — S79921A Unspecified injury of right thigh, initial encounter: Secondary | ICD-10-CM | POA: Diagnosis present

## 2015-05-03 MED ORDER — NAPROXEN 250 MG PO TABS
500.0000 mg | ORAL_TABLET | Freq: Once | ORAL | Status: AC
  Administered 2015-05-03: 500 mg via ORAL
  Filled 2015-05-03: qty 2

## 2015-05-03 MED ORDER — HYDROCODONE-ACETAMINOPHEN 5-325 MG PO TABS: 1.0000 | ORAL_TABLET | Freq: Four times a day (QID) | ORAL | Status: DC | PRN

## 2015-05-03 MED ORDER — MELOXICAM 15 MG PO TABS: 15.0000 mg | ORAL_TABLET | Freq: Every day | ORAL | Status: DC

## 2015-05-03 NOTE — ED Notes (Signed)
C/o posterior R thigh pain, onset 5 weeks ago, gradually progressively worse, no aleviating factors, worse with use and standing. Works for [employer], "on her feet all day". Denies other sx.

## 2015-05-03 NOTE — ED Provider Notes (Signed)
CSN: [id number]     Arrival date & time 05/03/15  0138 History   First MD Initiated Contact with Patient 05/03/15 0358     Chief Complaint  Patient presents with  . Leg Pain     (Consider location/radiation/quality/duration/timing/severity/associated sxs/prior Treatment) HPI  This is a 40 year old female with about a one-month history of pain in her right posterior distal thigh. It has been worsening and is now severe at its worst. It is worse when she first awakens and begins to move it and improves as she ambulates throughout the day. It is worse with extension of the right knee and relieved by flexion the right knee. There is no associated swelling or discoloration. She has tried ibuprofen without adequate relief. She denies a specific injury.  History reviewed. No pertinent past medical history. Past Surgical History  Procedure Laterality Date  . Cholecystectomy    . Tonsillectomy     History reviewed. No pertinent family history. Social History  Substance Use Topics  . Smoking status: Never Smoker   . Smokeless tobacco: None  . Alcohol Use: Yes     Comment: social   OB History    No data available     Review of Systems  All other systems reviewed and are negative.   Allergies  Review of patient's allergies indicates no known allergies.  Home Medications   Prior to Admission medications   Medication Sig Start Date End Date Taking? Authorizing Provider  albuterol (PROVENTIL HFA;VENTOLIN HFA) 108 (90 BASE) MCG/ACT inhaler Inhale 2 puffs into the lungs every 4 (four) hours as needed for wheezing. 04/08/14   Graylon Good, PA-C  azithromycin (ZITHROMAX Z-PAK) 250 MG tablet Use as directed 04/08/14   Graylon Good, PA-C  chlorpheniramine-HYDROcodone (TUSSIONEX PENNKINETIC ER) 10-8 MG/5ML LQCR Take 5 mLs by mouth every 12 (twelve) hours as needed for cough. 04/08/14   Adrian Blackwater Baker, PA-C  cyclobenzaprine (FLEXERIL) 10 MG tablet Take 1 tablet (10 mg total) by mouth 3  (three) times daily as needed for muscle spasms. 05/11/12   Jimmie Molly, MD  HYDROcodone-ibuprofen (VICOPROFEN) 7.5-200 MG per tablet Take 1 tablet by mouth every 8 (eight) hours as needed for pain. 05/11/12   Jimmie Molly, MD  meloxicam (MOBIC) 15 MG tablet Take 1 tablet (15 mg total) by mouth daily. 01/10/15   April Palumbo, MD  nitrofurantoin, macrocrystal-monohydrate, (MACROBID) 100 MG capsule Take 1 capsule (100 mg total) by mouth 2 (two) times daily. 01/09/14   Riki Sheer, PA-C  predniSONE (DELTASONE) 20 MG tablet Two daily with food 11/06/14   Elvina Sidle, MD  predniSONE (DELTASONE) 20 MG tablet Two daily with food 11/06/14   Elvina Sidle, MD   BP 124/82 mmHg  Pulse 79  Temp(Src) 98.5 F (36.9 C) (Oral)  Resp 16  Ht 4\' 11"  (1.499 m)  Wt 133 lb (60.328 kg)  BMI 26.85 kg/m2  SpO2 96%  LMP 05/01/2015 (Exact Date)   Physical Exam  General: Well-developed, well-nourished female in no acute distress; appearance consistent with age of record HENT: normocephalic; atraumatic Eyes: pupils equal, round and reactive to light; extraocular muscles intact Neck: supple Heart: regular rate and rhythm Lungs: clear to auscultation bilaterally Abdomen: soft; nondistended Extremities: No deformity; full range of motion; pulses normal; tenderness of posterior distal right thigh with exacerbation of pain on extension at the right knee and relief on flexion Neurologic: Awake, alert and oriented; motor function intact in all extremities and symmetric; no facial droop Skin: Warm and  dry Psychiatric: Normal mood and affect    ED Course  Procedures (including critical care time)   MDM  Examination consistent with tendinitis of the posterior thigh muscles. We will treat her with analgesics and refer her to sports medicine.   [name], MD 05/03/15 [phone]

## 2015-05-07 ENCOUNTER — Encounter: Payer: Self-pay | Admitting: Family Medicine

## 2015-05-07 ENCOUNTER — Ambulatory Visit (INDEPENDENT_AMBULATORY_CARE_PROVIDER_SITE_OTHER): Payer: 59 | Admitting: Family Medicine

## 2015-05-07 VITALS — BP 138/77 | HR 87 | Ht 59.0 in | Wt 135.0 lb

## 2015-05-07 DIAGNOSIS — S76311A Strain of muscle, fascia and tendon of the posterior muscle group at thigh level, right thigh, initial encounter: Secondary | ICD-10-CM

## 2015-05-07 MED ORDER — DICLOFENAC SODIUM 75 MG PO TBEC: 75.0000 mg | DELAYED_RELEASE_TABLET | Freq: Two times a day (BID) | ORAL | Status: DC

## 2015-05-07 MED ORDER — HYDROCODONE-ACETAMINOPHEN 5-325 MG PO TABS: 1.0000 | ORAL_TABLET | Freq: Four times a day (QID) | ORAL | Status: DC | PRN

## 2015-05-07 NOTE — Patient Instructions (Signed)
You have a hamstring strain. Wear compression sleeve when up and walking around for next 6 weeks. Voltaren twice a day with food for pain and inflammation. Norco as needed for severe pain but no driving on this. Heat 15 minutes at a time 3-4 times a day. Leg curls, hamstring swings, running lunges - add 2 pound weight with time if these are too easy. 3 sets of 10 once or twice a day. Consider physical therapy as well. Follow up with me in 4 weeks for reevaluation.

## 2015-05-07 NOTE — ED Notes (Signed)
Pt arrived in person to front desk stating she lost her paperwork from 05/03/2015 visit and is requesting discharge instructions be provided. AVS printed and given to patient, after proper identifications

## 2015-05-12 DIAGNOSIS — S76311A Strain of muscle, fascia and tendon of the posterior muscle group at thigh level, right thigh, initial encounter: Secondary | ICD-10-CM | POA: Insufficient documentation

## 2015-05-12 NOTE — Progress Notes (Signed)
PCP: No PCP Per Patient  Subjective:   HPI: Patient is a 40 y.o. female here for right hamstring pain.  Patient denies known injury. 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 Started to get pain posteriorly in right hamstring about 5 weeks ago. No swelling or bruising. Pain level 5/10, sharp and constant. Worse by end of work day. No skin changes, fever, other complaints.  No past medical history on file.  Current Outpatient Prescriptions on File Prior to Visit  Medication Sig Dispense Refill  . meloxicam (MOBIC) 15 MG tablet Take 1 tablet (15 mg total) by mouth daily. 15 tablet 0  . [DISCONTINUED] albuterol (PROVENTIL HFA;VENTOLIN HFA) 108 (90 BASE) MCG/ACT inhaler Inhale 2 puffs into the lungs every 4 (four) hours as needed for wheezing. 1 Inhaler 0   No current facility-administered medications on file prior to visit.    Past Surgical History  Procedure Laterality Date  . Cholecystectomy    . Tonsillectomy      No Known Allergies  Social History   Social History  . Marital Status: Single    Spouse Name: N/A  . Number of Children: N/A  . Years of Education: N/A   Occupational History  . Not on file.   Social History Main Topics  . Smoking status: Never Smoker   . Smokeless tobacco: Not on file  . Alcohol Use: 0.0 oz/week    0 Standard drinks or equivalent per week     Comment: social  . Drug Use: No  . Sexual Activity: Yes    Birth Control/ Protection: Injection   Other Topics Concern  . Not on file   Social History Narrative    No family history on file.  BP 138/77 mmHg  Pulse 87  Ht 4\' 11"  (1.499 m)  Wt 135 lb (61.236 kg)  BMI 27.25 kg/m2  LMP 05/01/2015 (Exact Date)  Review of Systems: See HPI above.    Objective:  Physical Exam:  Gen: NAD  Right leg: No gross deformity, swelling, bruising. TTP mid-hamstring medially.  No other tenderness. FROM knee and hip.  Pain on resisted knee flexion at 30 degrees with 5-/5 strength. NVI  distally. Negative SLR, piriformis and fabers.    Assessment & Plan:  1. Right hamstring strain - from overuse.  Compression sleeve and voltaren.  Norco as needed for severe pain.  Heat for spasms.  Shown home exercises to do daily.  Consider physical therapy if not improving.  F/u in 4 weeks.

## 2015-05-12 NOTE — Assessment & Plan Note (Signed)
from overuse.  Compression sleeve and voltaren.  Norco as needed for severe pain.  Heat for spasms.  Shown home exercises to do daily.  Consider physical therapy if not improving.  F/u in 4 weeks.

## 2015-06-04 ENCOUNTER — Encounter: Payer: Self-pay | Admitting: Family Medicine

## 2015-06-04 ENCOUNTER — Ambulatory Visit (INDEPENDENT_AMBULATORY_CARE_PROVIDER_SITE_OTHER): Payer: 59 | Admitting: Family Medicine

## 2015-06-04 VITALS — BP 132/87 | HR 90 | Ht 59.0 in | Wt 145.0 lb

## 2015-06-04 DIAGNOSIS — S76311D Strain of muscle, fascia and tendon of the posterior muscle group at thigh level, right thigh, subsequent encounter: Secondary | ICD-10-CM

## 2015-06-08 NOTE — Assessment & Plan Note (Signed)
from overuse.  Clinically improving.  Encouraged to continue with home exercises, heat.   Consider physical therapy if not improving.  F/u prn.

## 2015-06-08 NOTE — Progress Notes (Signed)
PCP: No PCP Per Patient  Subjective:   HPI: Patient is a 40 y.o. female here for right hamstring pain.  1/6: Patient denies known injury. 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 Started to get pain posteriorly in right hamstring about 5 weeks ago. No swelling or bruising. Pain level 5/10, sharp and constant. Worse by end of work day. No skin changes, fever, other complaints.  2/3: Patient reports she is improving. Doing home exercises, using heat. Sleeve felt too tight. Worse with prolonged sitting. Pain level 2/10. No skin changes, fever.  No past medical history on file.  Current Outpatient Prescriptions on File Prior to Visit  Medication Sig Dispense Refill  . diclofenac (VOLTAREN) 75 MG EC tablet Take 1 tablet (75 mg total) by mouth 2 (two) times daily. 60 tablet 1  . HYDROcodone-acetaminophen (NORCO/VICODIN) 5-325 MG tablet Take 1 tablet by mouth every 6 (six) hours as needed (for pain). 30 tablet 0  . meloxicam (MOBIC) 15 MG tablet Take 1 tablet (15 mg total) by mouth daily. 15 tablet 0  . [DISCONTINUED] albuterol (PROVENTIL HFA;VENTOLIN HFA) 108 (90 BASE) MCG/ACT inhaler Inhale 2 puffs into the lungs every 4 (four) hours as needed for wheezing. 1 Inhaler 0   No current facility-administered medications on file prior to visit.    Past Surgical History  Procedure Laterality Date  . Cholecystectomy    . Tonsillectomy      No Known Allergies  Social History   Social History  . Marital Status: Single    Spouse Name: N/A  . Number of Children: N/A  . Years of Education: N/A   Occupational History  . Not on file.   Social History Main Topics  . Smoking status: Never Smoker   . Smokeless tobacco: Not on file  . Alcohol Use: 0.0 oz/week    0 Standard drinks or equivalent per week     Comment: social  . Drug Use: No  . Sexual Activity: Yes    Birth Control/ Protection: Injection   Other Topics Concern  . Not on file   Social History Narrative     No family history on file.  BP 132/87 mmHg  Pulse 90  Ht  (1.499 m)  Wt 145 lb (65.772 kg)  BMI 29.27 kg/m2  Review of Systems: See HPI above.    Objective:  Physical Exam:  Gen: NAD  Right leg: No gross deformity, swelling, bruising. Minimal TTP mid-hamstring medially.  No other tenderness. FROM knee and hip.  No pain on resisted knee flexion at 30 degrees with 5-/5 strength. NVI distally. Negative SLR.    Assessment & Plan:  1. Right hamstring strain - from overuse.  Clinically improving.  Encouraged to continue with home exercises, heat.   Consider physical therapy if not improving.  F/u prn.

## 2015-10-28 ENCOUNTER — Emergency Department (HOSPITAL_BASED_OUTPATIENT_CLINIC_OR_DEPARTMENT_OTHER)
Admission: EM | Admit: 2015-10-28 | Discharge: 2015-10-28 | Disposition: A | Discharge status: Home / Self Care | Payer: 59 | Attending: Emergency Medicine | Admitting: Emergency Medicine

## 2015-10-28 ENCOUNTER — Encounter (HOSPITAL_BASED_OUTPATIENT_CLINIC_OR_DEPARTMENT_OTHER): Payer: Self-pay | Admitting: *Deleted

## 2015-10-28 DIAGNOSIS — M549 Dorsalgia, unspecified: Secondary | ICD-10-CM | POA: Diagnosis present

## 2015-10-28 DIAGNOSIS — M62830 Muscle spasm of back: Secondary | ICD-10-CM | POA: Diagnosis not present

## 2015-10-28 MED ORDER — KETOROLAC TROMETHAMINE 30 MG/ML IJ SOLN: 30.0000 mg | Freq: Once | INTRAMUSCULAR | Status: DC

## 2015-10-28 MED ORDER — CYCLOBENZAPRINE HCL 10 MG PO TABS: 10.0000 mg | ORAL_TABLET | Freq: Two times a day (BID) | ORAL | Status: DC | PRN

## 2015-10-28 MED ORDER — NAPROXEN 500 MG PO TABS: 500.0000 mg | ORAL_TABLET | Freq: Two times a day (BID) | ORAL | Status: DC

## 2015-10-28 MED ORDER — KETOROLAC TROMETHAMINE 30 MG/ML IJ SOLN
30.0000 mg | Freq: Once | INTRAMUSCULAR | Status: AC
  Administered 2015-10-28: 30 mg via INTRAMUSCULAR
  Filled 2015-10-28: qty 1

## 2015-10-28 NOTE — ED Notes (Signed)
C/o pain in neck to mid back x 1 week  Worse since 9 last pm

## 2015-10-28 NOTE — Discharge Instructions (Signed)

## 2015-10-28 NOTE — ED Provider Notes (Signed)
CSN: [id number]     Arrival date & time 10/28/15  0124 History   First MD Initiated Contact with Patient 10/28/15 0153     Chief Complaint  Patient presents with  . Torticollis     (Consider location/radiation/quality/duration/timing/severity/associated sxs/prior Treatment) HPI  This is a 40 year old female who presents with back and neck pain. Patient reports one-week history of worsening back and neck pain. She states that she woke up tonight and it was worse and she had difficulty moving her neck without pain. She reports that she lifts heavy objects at work frequently. She took ibuprofen at home with no relief. Currently she rates pain at 6 out of 10. It is worse with flexion of the neck. She denies any fevers or headache. She denies any numbness and tingling in her arms or legs. Denies any bowel or bladder difficulty. Denies any history of drug use or cancer.  History reviewed. No pertinent past medical history. Past Surgical History  Procedure Laterality Date  . Cholecystectomy    . Tonsillectomy     No family history on file. Social History  Substance Use Topics  . Smoking status: Never Smoker   . Smokeless tobacco: None  . Alcohol Use: 0.0 oz/week    0 Standard drinks or equivalent per week     Comment: social   OB History    No data available     Review of Systems  Constitutional: Negative for fever.  Genitourinary: Negative for difficulty urinating.  Musculoskeletal: Positive for back pain and neck stiffness.  Neurological: Negative for weakness and numbness.  All other systems reviewed and are negative.     Allergies  Review of patient's allergies indicates no known allergies.  Home Medications   Prior to Admission medications   Medication Sig Start Date End Date Taking? Authorizing Provider  cyclobenzaprine (FLEXERIL) 10 MG tablet Take 1 tablet (10 mg total) by mouth 2 (two) times daily as needed for muscle spasms. 10/28/15   Shon Batonourtney F Horton, MD   diclofenac (VOLTAREN) 75 MG EC tablet Take 1 tablet (75 mg total) by mouth 2 (two) times daily. 05/07/15   Lenda KelpShane R Hudnall, MD  HYDROcodone-acetaminophen (NORCO/VICODIN) 5-325 MG tablet Take 1 tablet by mouth every 6 (six) hours as needed (for pain). 05/07/15   Lenda KelpShane R Hudnall, MD  meloxicam (MOBIC) 15 MG tablet Take 1 tablet (15 mg total) by mouth daily. 05/03/15   John Molpus, MD  naproxen (NAPROSYN) 500 MG tablet Take 1 tablet (500 mg total) by mouth 2 (two) times daily. 10/28/15   Shon Batonourtney F Horton, MD   BP 124/81 mmHg  Pulse 87  Temp(Src) 98.7 F (37.1 C) (Oral)  Resp 18  Ht 4\' 11"  (1.499 m)  Wt 145 lb (65.772 kg)  BMI 29.27 kg/m2  SpO2 98% Physical Exam  Constitutional: She is oriented to person, place, and time. She appears well-developed and well-nourished. No distress.  HENT:  Head: Normocephalic and atraumatic.  Neck: Neck supple.  Patient does have full range of motion but somewhat slow secondary to pain, no meningismus  Cardiovascular: Normal rate, regular rhythm and normal heart sounds.   Pulmonary/Chest: Effort normal and breath sounds normal. No respiratory distress. She has no wheezes.  Abdominal: Soft. Bowel sounds are normal.  Musculoskeletal:  Tenderness to palpation over the bilateral upper trapezius muscles  Neurological: She is alert and oriented to person, place, and time.  5 out of 5 strength bilateral upper extremities including grip, normal reflexes, no clonus  Skin: Skin  is warm and dry.  Psychiatric: She has a normal mood and affect.  Nursing note and vitals reviewed.   ED Course  Procedures (including critical care time) Labs Review Labs Reviewed - No data to display  Imaging Review No results found. I have personally reviewed and evaluated these images and lab results as part of my medical decision-making.   EKG Interpretation None      MDM   Final diagnoses:  Muscle spasm of back    Patient presents with worsening back and neck pain.  Appears muscular skeletal origin. No signs or symptoms of radiculopathy or nerve pain. No other red flags back pain. Patient is driving. She was given Toradol. Discharged with naproxen and Flexeril. I have discussed with the patient work modifications including avoiding heavy lifting until symptoms are improving.  After history, exam, and medical workup I feel the patient has been appropriately medically screened and is safe for discharge home. Pertinent diagnoses were discussed with the patient. Patient was given return precautions.     Shon Batonourtney F Horton, MD 10/28/15 [phone]

## 2016-06-17 ENCOUNTER — Encounter (HOSPITAL_BASED_OUTPATIENT_CLINIC_OR_DEPARTMENT_OTHER): Payer: Self-pay | Admitting: *Deleted

## 2016-06-17 ENCOUNTER — Emergency Department (HOSPITAL_BASED_OUTPATIENT_CLINIC_OR_DEPARTMENT_OTHER): Payer: 59

## 2016-06-17 ENCOUNTER — Emergency Department (HOSPITAL_BASED_OUTPATIENT_CLINIC_OR_DEPARTMENT_OTHER)
Admission: EM | Admit: 2016-06-17 | Discharge: 2016-06-17 | Disposition: A | Discharge status: Home / Self Care | Payer: 59 | Attending: Emergency Medicine | Admitting: Emergency Medicine

## 2016-06-17 DIAGNOSIS — R059 Cough, unspecified: Secondary | ICD-10-CM

## 2016-06-17 DIAGNOSIS — Z79899 Other long term (current) drug therapy: Secondary | ICD-10-CM | POA: Diagnosis not present

## 2016-06-17 DIAGNOSIS — R0981 Nasal congestion: Secondary | ICD-10-CM | POA: Insufficient documentation

## 2016-06-17 DIAGNOSIS — R05 Cough: Secondary | ICD-10-CM | POA: Diagnosis not present

## 2016-06-17 MED ORDER — PSEUDOEPHEDRINE HCL ER 120 MG PO TB12
120.0000 mg | ORAL_TABLET | Freq: Two times a day (BID) | ORAL | Status: DC
  Filled 2016-06-17: qty 1

## 2016-06-17 MED ORDER — BENZONATATE 100 MG PO CAPS: 100.0000 mg | ORAL_CAPSULE | Freq: Three times a day (TID) | ORAL | 0 refills | Status: DC

## 2016-06-17 MED ORDER — CETIRIZINE-PSEUDOEPHEDRINE ER 5-120 MG PO TB12: 1.0000 | ORAL_TABLET | Freq: Two times a day (BID) | ORAL | 0 refills | Status: DC

## 2016-06-17 MED ORDER — LORATADINE 10 MG PO TABS
10.0000 mg | ORAL_TABLET | Freq: Once | ORAL | Status: AC
  Administered 2016-06-17: 10 mg via ORAL
  Filled 2016-06-17: qty 1

## 2016-06-17 MED ORDER — PSEUDOEPHEDRINE HCL 30 MG PO TABS
ORAL_TABLET | ORAL | Status: AC
  Administered 2016-06-17: 60 mg via ORAL
  Filled 2016-06-17: qty 2

## 2016-06-17 MED ORDER — FLUTICASONE PROPIONATE 50 MCG/ACT NA SUSP: 2.0000 | Freq: Every day | NASAL | 0 refills | Status: DC

## 2016-06-17 MED ORDER — PSEUDOEPHEDRINE HCL 30 MG PO TABS
60.0000 mg | ORAL_TABLET | Freq: Once | ORAL | Status: AC
  Administered 2016-06-17: 60 mg via ORAL

## 2016-06-17 MED ORDER — BENZONATATE 100 MG PO CAPS
200.0000 mg | ORAL_CAPSULE | Freq: Once | ORAL | Status: AC
  Administered 2016-06-17: 200 mg via ORAL
  Filled 2016-06-17: qty 2

## 2016-06-17 NOTE — ED Provider Notes (Signed)
[facility] Provider [facility]   CSN: [id number] Arrival date & time: 06/17/16  0145     History   Chief Complaint No chief complaint on file.   HPI Kristin Guzman is a [age] y.o. female.  The history is provided by the patient.  URI   This is a new problem. The current episode started 2 days ago. The problem has not changed since onset.There has been no fever. Associated symptoms include congestion and cough. Pertinent negatives include no chest pain, no abdominal pain, no diarrhea, no nausea, no vomiting, no dysuria, no ear pain, no headaches, no plugged ear sensation, no rhinorrhea, no sinus pain, no sneezing, no sore throat, no swollen glands, no joint pain, no joint swelling, no neck pain and no wheezing. She has tried nothing for the symptoms.    History reviewed. No pertinent past medical history.  Patient Active Problem List   Diagnosis Date Noted  . Right hamstring muscle strain 05/12/2015    Past Surgical History:  Procedure Laterality Date  . CHOLECYSTECTOMY    . TONSILLECTOMY      OB History    No data available       Home Medications    Prior to Admission medications   Medication Sig Start Date End Date Taking? Authorizing Provider  ALPRAZolam Prudy Feeler(XANAX) 0.5 MG tablet Take 0.5 mg by mouth 2 (two) times daily as needed for anxiety.   Yes Historical Provider, MD    Family History History reviewed. No pertinent family history.  Social History Social History  Substance Use Topics  . Smoking status: Never Smoker  . Smokeless tobacco: Never Used  . Alcohol use 0.0 oz/week     Comment: social     Allergies   Augmentin [amoxicillin-pot clavulanate]   Review of Systems Review of Systems  HENT: Positive for congestion. Negative for ear pain, rhinorrhea, sinus pain, sneezing and sore throat.   Respiratory: Positive for cough. Negative for shortness of breath and wheezing.   Cardiovascular: Negative for chest pain, palpitations and leg swelling.    Gastrointestinal: Negative for abdominal pain, diarrhea, nausea and vomiting.  Genitourinary: Negative for dysuria.  Musculoskeletal: Negative for joint pain and neck pain.  Neurological: Negative for headaches.  All other systems reviewed and are negative.    Physical Exam Updated Vital Signs BP 123/89 (BP Location: Right Arm)   Pulse 96   Temp 98.1 F (36.7 C) (Oral)   Resp 18   Ht 4\' 11"  (1.499 m)   Wt 140 lb (63.5 kg)   LMP 06/12/2016 (Exact Date)   SpO2 98%   BMI 28.28 kg/m   Physical Exam  Constitutional: She is oriented to person, place, and time. She appears well-developed and well-nourished. No distress.  HENT:  Head: Normocephalic and atraumatic.  Mouth/Throat: No oropharyngeal exudate.  Eyes: EOM are normal. Pupils are equal, round, and reactive to light.  Neck: Normal range of motion. Neck supple.  Cardiovascular: Normal rate, regular rhythm and intact distal pulses.   Pulmonary/Chest: Effort normal and breath sounds normal. No stridor. No respiratory distress. She has no wheezes. She has no rales.  Abdominal: Soft. Bowel sounds are normal. She exhibits no mass. There is no tenderness. There is no rebound and no guarding.  Musculoskeletal: Normal range of motion.  Lymphadenopathy:    She has no cervical adenopathy.  Neurological: She is alert and oriented to person, place, and time.  Skin: Skin is warm and dry. Capillary refill takes less than 2 seconds.  Psychiatric:  She has a normal mood and affect.     ED Treatments / Results   Vitals:   06/17/16 0157  BP: 123/89  Pulse: 96  Resp: 18  Temp: 98.1 F (36.7 C)    Radiology Dg Chest 2 View  Result Date: 06/17/2016 CLINICAL DATA:  Initial evaluation for acute cough, wheezing, question fever. History of bronchitis. EXAM: CHEST  2 VIEW COMPARISON:  None. FINDINGS: The heart size and mediastinal contours are within normal limits. Both lungs are clear. The visualized skeletal structures are unremarkable.  IMPRESSION: No radiographic evidence for active cardiopulmonary disease. Electronically Signed   By: Rise Mu M.D.   On: 06/17/2016 02:20    Procedures Procedures (including critical care time)  Medications Ordered in ED Medications  benzonatate (TESSALON) capsule 200 mg (not administered)  loratadine (CLARITIN) tablet 10 mg (not administered)  pseudoephedrine (SUDAFED) 12 hr tablet 120 mg (not administered)      Final Clinical Impressions(s) / ED Diagnoses  Viral cough: All questions answered to patient's satisfaction. Based on history and exam patient has been appropriately medically screened and emergency conditions excluded. Patient is stable for discharge at this time. Strict return precautions given for any further episodes, persistent fever, weakness or any concerns. New Prescriptions New Prescriptions   No medications on file     Arias Weinert, MD 06/17/16 [phone]

## 2016-06-17 NOTE — ED Triage Notes (Signed)
Pt began having nasal congestion cough  and chills x 5 days ago. Cough continues and is worsening x last 2 days.

## 2016-10-01 ENCOUNTER — Encounter (HOSPITAL_BASED_OUTPATIENT_CLINIC_OR_DEPARTMENT_OTHER): Payer: Self-pay

## 2016-10-01 ENCOUNTER — Emergency Department (HOSPITAL_BASED_OUTPATIENT_CLINIC_OR_DEPARTMENT_OTHER)
Admission: EM | Admit: 2016-10-01 | Discharge: 2016-10-01 | Disposition: A | Discharge status: Home / Self Care | Payer: 59 | Attending: Emergency Medicine | Admitting: Emergency Medicine

## 2016-10-01 DIAGNOSIS — Z79899 Other long term (current) drug therapy: Secondary | ICD-10-CM | POA: Diagnosis not present

## 2016-10-01 DIAGNOSIS — T3695XA Adverse effect of unspecified systemic antibiotic, initial encounter: Secondary | ICD-10-CM

## 2016-10-01 DIAGNOSIS — K0889 Other specified disorders of teeth and supporting structures: Secondary | ICD-10-CM

## 2016-10-01 DIAGNOSIS — B379 Candidiasis, unspecified: Secondary | ICD-10-CM | POA: Diagnosis not present

## 2016-10-01 MED ORDER — CLINDAMYCIN HCL 300 MG PO CAPS: 300.0000 mg | ORAL_CAPSULE | Freq: Three times a day (TID) | ORAL | 0 refills | Status: DC

## 2016-10-01 MED ORDER — FLUCONAZOLE 200 MG PO TABS: 200.0000 mg | ORAL_TABLET | Freq: Every day | ORAL | 0 refills | Status: AC

## 2016-10-01 MED ORDER — CLOTRIMAZOLE 1 % VA CREA: 1.0000 | TOPICAL_CREAM | Freq: Every day | VAGINAL | 0 refills | Status: DC

## 2016-10-01 NOTE — ED Triage Notes (Signed)
Pt reports left lower abscess, dental pain - prescribed abt 2 weeks ago, but stopped it due to yeast infection - dental appt this week, swelling has increased, states restarted abt but it only gave her an additional yeast infection.

## 2016-10-01 NOTE — Discharge Instructions (Signed)
Please continue taking antibiotics as prescribed, it has been refilled.  Take diflucan and vaginal yeast cream as instructed.   Continue taking probiotics  Follow up with dentist appointment.  Take ibuprofen 600 mg every 8 hours for inflammation and pain

## 2016-10-01 NOTE — ED Provider Notes (Signed)
[facility] Provider Note   CSN: [id number] Arrival date & time: 10/01/16  1153  By signing my name below, I, Deland Pretty, attest that this documentation has been prepared under the direction and in the presence of Sharen Heck, PA-C. Electronically Signed: Deland Pretty, ED Scribe. 10/01/16. 4:47pm.  History   Chief Complaint Chief Complaint  Patient presents with  . Dental Pain   The history is provided by the patient. No language interpreter was used.   HPI Comments: Kristin Guzman is a 41 y.o. female who presents to the Emergency Department complaining of moderate, gradually worsening dental pain with associated facial swelling on her lower left jaw. She reports that she has some broken teeth and she was seen on 09/12/2016 for her dental pain where she was prescribed antibiotics. The pt indicates that she developed a yeast infection from antibiotics so she stopped taking antibiotics and took 1 pill of diflucan, and believes that the tooth infection has returned. She reports that she has a dental appointment in two days.  She requests refill of clindamycin and yeast infection medications. She currently does not have any urinary or vaginal symptoms but feels like "yeast infection is coming soon".  The pt does not have associated fever, trismus,  nausea, vomiting, dysuria, and vaginal discharge.  History reviewed. No pertinent past medical history.  Patient Active Problem List   Diagnosis Date Noted  . Right hamstring muscle strain 05/12/2015    Past Surgical History:  Procedure Laterality Date  . CHOLECYSTECTOMY    . TONSILLECTOMY      OB History    No data available       Home Medications    Prior to Admission medications   Medication Sig Start Date End Date Taking? Authorizing Provider  ALPRAZolam Prudy Feeler) 0.5 MG tablet Take 0.5 mg by mouth 2 (two) times daily as needed for anxiety.   Yes [provider]  Hydrocodone-Acetaminophen (LORTAB PO)  Take by mouth.   Yes [provider]  ibuprofen (ADVIL,MOTRIN) 400 MG tablet Take 400 mg by mouth every 6 (six) hours as needed.   Yes [provider]  benzonatate (TESSALON) 100 MG capsule Take 1 capsule (100 mg total) by mouth every 8 (eight) hours. 06/17/16   Palumbo, April, MD  cetirizine-pseudoephedrine (ZYRTEC-D ALLERGY & CONGESTION) 5-120 MG tablet Take 1 tablet by mouth 2 (two) times daily. 06/17/16   Palumbo, April, MD  clindamycin (CLEOCIN) 300 MG capsule Take 1 capsule (300 mg total) by mouth 3 (three) times daily. 10/01/16   Liberty Handy, PA-C  clotrimazole (GYNE-LOTRIMIN) 1 % vaginal cream Place 1 Applicatorful vaginally at bedtime. 10/01/16   Liberty Handy, PA-C  fluconazole (DIFLUCAN) 200 MG tablet Take 1 tablet (200 mg total) by mouth daily. 10/01/16 10/08/16  Liberty Handy, PA-C  fluticasone (FLONASE) 50 MCG/ACT nasal spray Place 2 sprays into both nostrils daily. 06/17/16   Palumbo, April, MD    Family History History reviewed. No pertinent family history.  Social History Social History  Substance Use Topics  . Smoking status: Never Smoker  . Smokeless tobacco: Never Used  . Alcohol use 0.0 oz/week     Comment: social     Allergies   Augmentin [amoxicillin-pot clavulanate]   Review of Systems Review of Systems  Constitutional: Negative for fever.  HENT: Positive for dental problem and facial swelling.   Gastrointestinal: Negative for nausea and vomiting.  Genitourinary: Negative for dysuria and vaginal discharge.  All other systems reviewed and are  negative.    Physical Exam Updated Vital Signs BP (!) 150/101 (BP Location: Right Arm)   Pulse 88   Temp 98.3 F (36.8 C)   Resp 14   Ht 4\' 11"  (1.499 m)   Wt 59 kg (130 lb)   LMP 09/24/2016   SpO2 99%   BMI 26.26 kg/m   Physical Exam  Constitutional: She is oriented to person, place, and time. She appears well-developed and well-nourished. No distress.  NAD.  HENT:  Head:  Normocephalic and atraumatic.  Right Ear: External ear normal.  Left Ear: External ear normal.  Nose: Nose normal.  Mouth/Throat: Abnormal dentition.  3 teeth cracked on lower left side Mild left sided facial swelling and tenderness along left jaw without surrounding erythema, edema, warmth or fluctuance.  Poor dentition.  No sublingual edema or tenderness.  Soft palate flat without tenderness.  No trismus.   No pooling of oral secretions.  Phonation normal, no hot potato voice.  Maxilla and mandible nontender. Mastoids without edema, erythema or tenderness.    Eyes: Conjunctivae and EOM are normal. No scleral icterus.  Neck: Normal range of motion. Neck supple.  Cardiovascular: Normal rate, regular rhythm and normal heart sounds.   No murmur heard. Pulmonary/Chest: Effort normal and breath sounds normal. She has no wheezes.  Genitourinary:  Genitourinary Comments: Patient declined pelvic exam, has no urinary or vaginal discharge at this time  Musculoskeletal: Normal range of motion. She exhibits no deformity.  Neurological: She is alert and oriented to person, place, and time.  Skin: Skin is warm and dry. Capillary refill takes less than 2 seconds.  Psychiatric: She has a normal mood and affect. Her behavior is normal. Judgment and thought content normal.  Nursing note and vitals reviewed.    ED Treatments / Results   DIAGNOSTIC STUDIES: Oxygen Saturation is 98% on RA, normal by my interpretation.   COORDINATION OF CARE: 4:25 PM-Discussed next steps with pt. Pt verbalized understanding and is agreeable with the plan.   Labs (all labs ordered are listed, but only abnormal results are displayed) Labs Reviewed - No data to display  EKG  EKG Interpretation None       Radiology No results found.  Procedures Procedures (including critical care time)  Medications Ordered in ED Medications - No data to display   Initial Impression / Assessment and Plan / ED  Course  I have reviewed the triage vital signs and the nursing notes.  Pertinent labs & imaging results that were available during my care of the patient were reviewed by me and considered in my medical decision making (see chart for details).      Dental pain associated with broken teeth and possible dental infection.  Patient developed antibiotic induced vaginal yeast infection while taking antibiotics, which she self treated with diflucan.  Today she has no vaginal or urinary symptoms.  She  requests refill of clindamycin and diflucan. Patient has appointment with dentist in 2 days. No associated constitutional symptoms, urinary or vaginal symptoms currently.  Patient declined pelvic exam, reasonoable as she is asymptomatic. She is afraid she will get a yeast infection again and has no more diflucan.. Patient afebrile, non toxic appearing, swallowing secretions well without hot potato voice. Exam unconcerning for Ludwig's angina or other deep tissue infection in neck.  As there is gum swelling with fluctuance, erythema, and facial swelling, will refill antibiotics. Urged patient to follow-up with dentist. Strict ED return precautions given. Pt is aware of red flag symptoms  that would warrant return to ED for re-evaluation and further treatment. Patient voices understanding and is agreeable to plan.   Final Clinical Impressions(s) / ED Diagnoses   Final diagnoses:  Pain, dental  Antibiotic-induced yeast infection    New Prescriptions Discharge Medication List as of 10/01/2016  4:45 PM    START taking these medications   Details  clotrimazole (GYNE-LOTRIMIN) 1 % vaginal cream Place 1 Applicatorful vaginally at bedtime., Starting Sun 10/01/2016, Print    fluconazole (DIFLUCAN) 200 MG tablet Take 1 tablet (200 mg total) by mouth daily., Starting Sun 10/01/2016, Until Sun 10/08/2016, Print        I personally performed the services described in this documentation, which was scribed in my  presence. The recorded information has been reviewed and is accurate.     Liberty HandyGibbons, Brayden Brodhead J, PA-C 10/01/16 1656    Tegeler, Canary Brimhristopher J, MD 10/02/16 [phone]

## 2017-06-25 ENCOUNTER — Encounter (HOSPITAL_BASED_OUTPATIENT_CLINIC_OR_DEPARTMENT_OTHER): Payer: Self-pay | Admitting: Emergency Medicine

## 2017-06-25 ENCOUNTER — Other Ambulatory Visit: Payer: Self-pay

## 2017-06-25 ENCOUNTER — Emergency Department (HOSPITAL_BASED_OUTPATIENT_CLINIC_OR_DEPARTMENT_OTHER)
Admission: EM | Admit: 2017-06-25 | Discharge: 2017-06-25 | Disposition: A | Discharge status: Home / Self Care | Payer: 59 | Attending: Physician Assistant | Admitting: Physician Assistant

## 2017-06-25 DIAGNOSIS — F410 Panic disorder [episodic paroxysmal anxiety] without agoraphobia: Secondary | ICD-10-CM | POA: Diagnosis not present

## 2017-06-25 DIAGNOSIS — Z79899 Other long term (current) drug therapy: Secondary | ICD-10-CM | POA: Diagnosis not present

## 2017-06-25 HISTORY — DX: Depression, unspecified: F32.A

## 2017-06-25 HISTORY — DX: Panic disorder (episodic paroxysmal anxiety): F41.0

## 2017-06-25 HISTORY — DX: Major depressive disorder, single episode, unspecified: F32.9

## 2017-06-25 HISTORY — DX: Anxiety disorder, unspecified: F41.9

## 2017-06-25 MED ORDER — ALPRAZOLAM 0.25 MG PO TABS: 0.2500 mg | ORAL_TABLET | Freq: Two times a day (BID) | ORAL | 0 refills | Status: DC | PRN

## 2017-06-25 MED FILL — ALPRAZolam 0.25 MG TABS: 0.25 | 3 days supply | Qty: 5 | Fill #0

## 2017-06-25 NOTE — ED Provider Notes (Signed)
[facility] EMERGENCY DEPARTMENT Provider Note   CSN: [id number] Arrival date & time: 06/25/17  0908     History   Chief Complaint Chief Complaint  Patient presents with  . Panic Attack    HPI Kristin Guzman is a 42 y.o. female.  HPI   42 year old female with history of anxiety and depression presents with panic attack.  Patient has had this multiple times in the past.  She ran out of her Xanax at home.  Patient reports it was a stressor at work that led to this.  She had her usual anxiety and symptoms of shortness of breath and anxiety.  She feels much better now.  Past Medical History:  Diagnosis Date  . Anxiety   . Depression   . Panic attack     Patient Active Problem List   Diagnosis Date Noted  . Right hamstring muscle strain 05/12/2015    Past Surgical History:  Procedure Laterality Date  . CHOLECYSTECTOMY    . TONSILLECTOMY      OB History    No data available       Home Medications    Prior to Admission medications   Medication Sig Start Date End Date Taking? Authorizing Provider  ALPRAZolam (XANAX) 0.25 MG tablet Take 1 tablet (0.25 mg total) by mouth 2 (two) times daily as needed for anxiety. 06/25/17   Shayonna Ocampo Lyn, MD  ALPRAZolam (XANAX) 0.5 MG tablet Take 0.5 mg by mouth 2 (two) times daily as needed for anxiety.    [provider]  benzonatate (TESSALON) 100 MG capsule Take 1 capsule (100 mg total) by mouth every 8 (eight) hours. 06/17/16   Palumbo, April, MD  cetirizine-pseudoephedrine (ZYRTEC-D ALLERGY & CONGESTION) 5-120 MG tablet Take 1 tablet by mouth 2 (two) times daily. 06/17/16   Palumbo, April, MD  clindamycin (CLEOCIN) 300 MG capsule Take 1 capsule (300 mg total) by mouth 3 (three) times daily. 10/01/16   Liberty Handy, PA-C  clotrimazole (GYNE-LOTRIMIN) 1 % vaginal cream Place 1 Applicatorful vaginally at bedtime. 10/01/16   Liberty Handy, PA-C  fluticasone (FLONASE) 50 MCG/ACT nasal spray Place 2 sprays  into both nostrils daily. 06/17/16   Palumbo, April, MD  Hydrocodone-Acetaminophen (LORTAB PO) Take by mouth.    [provider]  ibuprofen (ADVIL,MOTRIN) 400 MG tablet Take 400 mg by mouth every 6 (six) hours as needed.    [provider]  albuterol (PROVENTIL HFA;VENTOLIN HFA) 108 (90 BASE) MCG/ACT inhaler Inhale 2 puffs into the lungs every 4 (four) hours as needed for wheezing. 04/08/14 05/03/15  Graylon Good, PA-C    Family History No family history on file.  Social History Social History   Tobacco Use  . Smoking status: Never Smoker  . Smokeless tobacco: Never Used  Substance Use Topics  . Alcohol use: Yes    Alcohol/week: 0.0 oz    Comment: social  . Drug use: No     Allergies   Augmentin [amoxicillin-pot clavulanate]   Review of Systems Review of Systems  Constitutional: Negative for activity change.  Respiratory: Positive for chest tightness and shortness of breath.   Cardiovascular: Negative for chest pain.  Gastrointestinal: Negative for abdominal pain.  Psychiatric/Behavioral: The patient is nervous/anxious.   All other systems reviewed and are negative.    Physical Exam Updated Vital Signs BP (!) 158/96 (BP Location: Right Arm)   Pulse (!) 119   Temp 98.2 F (36.8 C) (Oral)   Resp 16   Ht  4\' 11"  (1.499 m)   Wt 65.8 kg (145 lb)   LMP 05/25/2017   SpO2 99%   BMI 29.29 kg/m   Physical Exam  Constitutional: She is oriented to person, place, and time. She appears well-developed and well-nourished.  HENT:  Head: Normocephalic and atraumatic.  Eyes: Right eye exhibits no discharge. Left eye exhibits no discharge.  Cardiovascular: Normal rate and regular rhythm.  Pulmonary/Chest: Effort normal and breath sounds normal. No respiratory distress.  Neurological: She is oriented to person, place, and time.  Skin: Skin is warm and dry. She is not diaphoretic.  Psychiatric: She has a normal mood and affect.  Nursing note and vitals  reviewed.    ED Treatments / Results  Labs (all labs ordered are listed, but only abnormal results are displayed) Labs Reviewed - No data to display  EKG  EKG Interpretation None       Radiology No results found.  Procedures Procedures (including critical care time)  Medications Ordered in ED Medications - No data to display   Initial Impression / Assessment and Plan / ED Course  I have reviewed the triage vital signs and the nursing notes.  Pertinent labs & imaging results that were available during my care of the patient were reviewed by me and considered in my medical decision making (see chart for details).     42 year old female with history of anxiety and depression presents with panic attack.  Patient has had this multiple times in the past.  She ran out of her Xanax at home.  Patient reports it was a stressor at work that led to this.  She had her usual anxiety and symptoms of shortness of breath and anxiety.  She feels much better now.  9:45 AM Will give medication to help until she can follow-up with her primary care physician.  Final Clinical Impressions(s) / ED Diagnoses   Final diagnoses:  Panic attack    ED Discharge Orders        Ordered    ALPRAZolam (XANAX) 0.25 MG tablet  2 times daily PRN     06/25/17 0944       Abelino DerrickMackuen, Verenise Moulin Lyn, MD 06/25/17 0945

## 2017-06-25 NOTE — Discharge Instructions (Signed)
Please follow-up with your primary care physician for additional medications.  Sometimes talk therapy can really help with your anxiety symptoms.  Please return with any concerns.

## 2017-06-25 NOTE — ED Triage Notes (Signed)
Pt reports anxiety attack that started at work about 7:30 - 8 am.  "Something happened at work and it just got worse."  Sts she usually takes generic Xanax but she is out.

## 2017-11-09 IMAGING — CR DG CHEST 2V
2 series · 2 of 2 positions shown · non-contrast
Comparison: None.

CLINICAL DATA: Initial evaluation for acute cough, wheezing,
question fever. History of bronchitis.

EXAM:
CHEST  2 VIEW

[w chest pa]
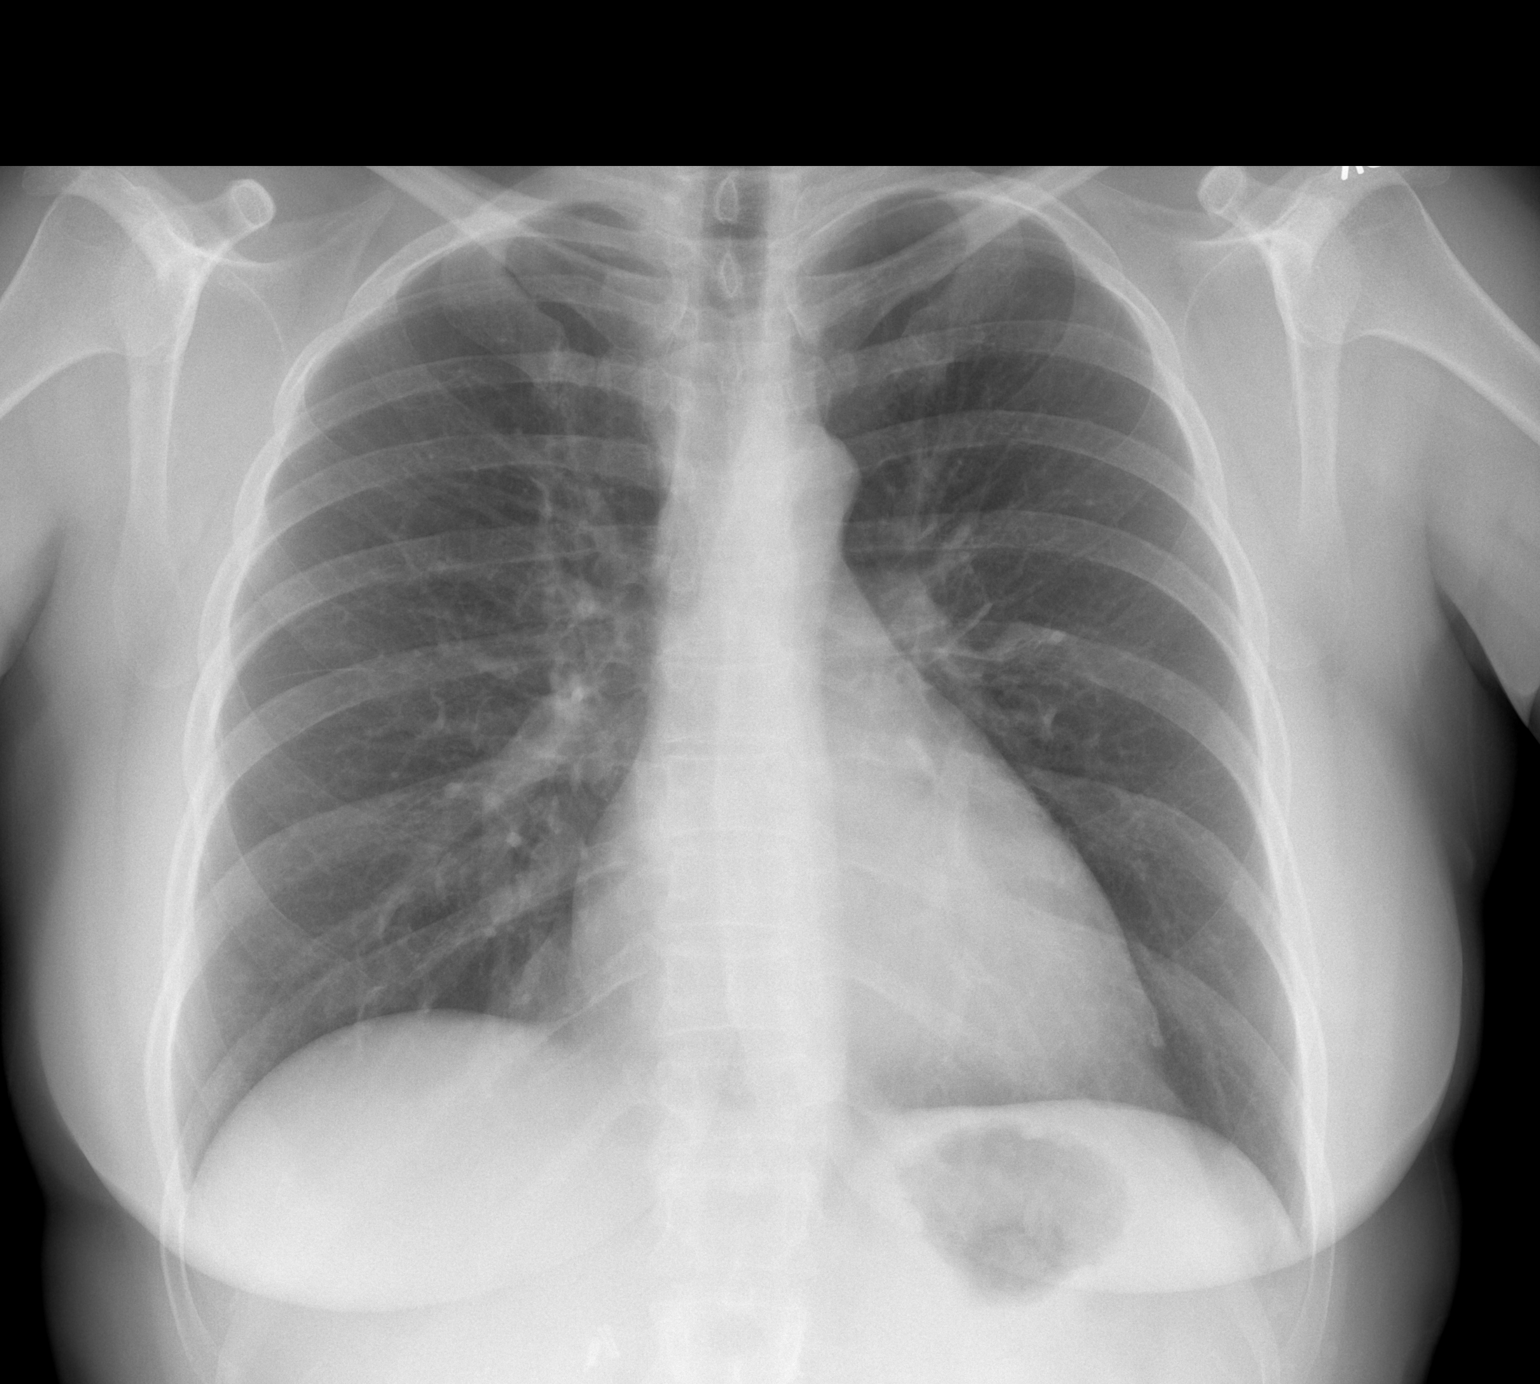

[w chest lat]
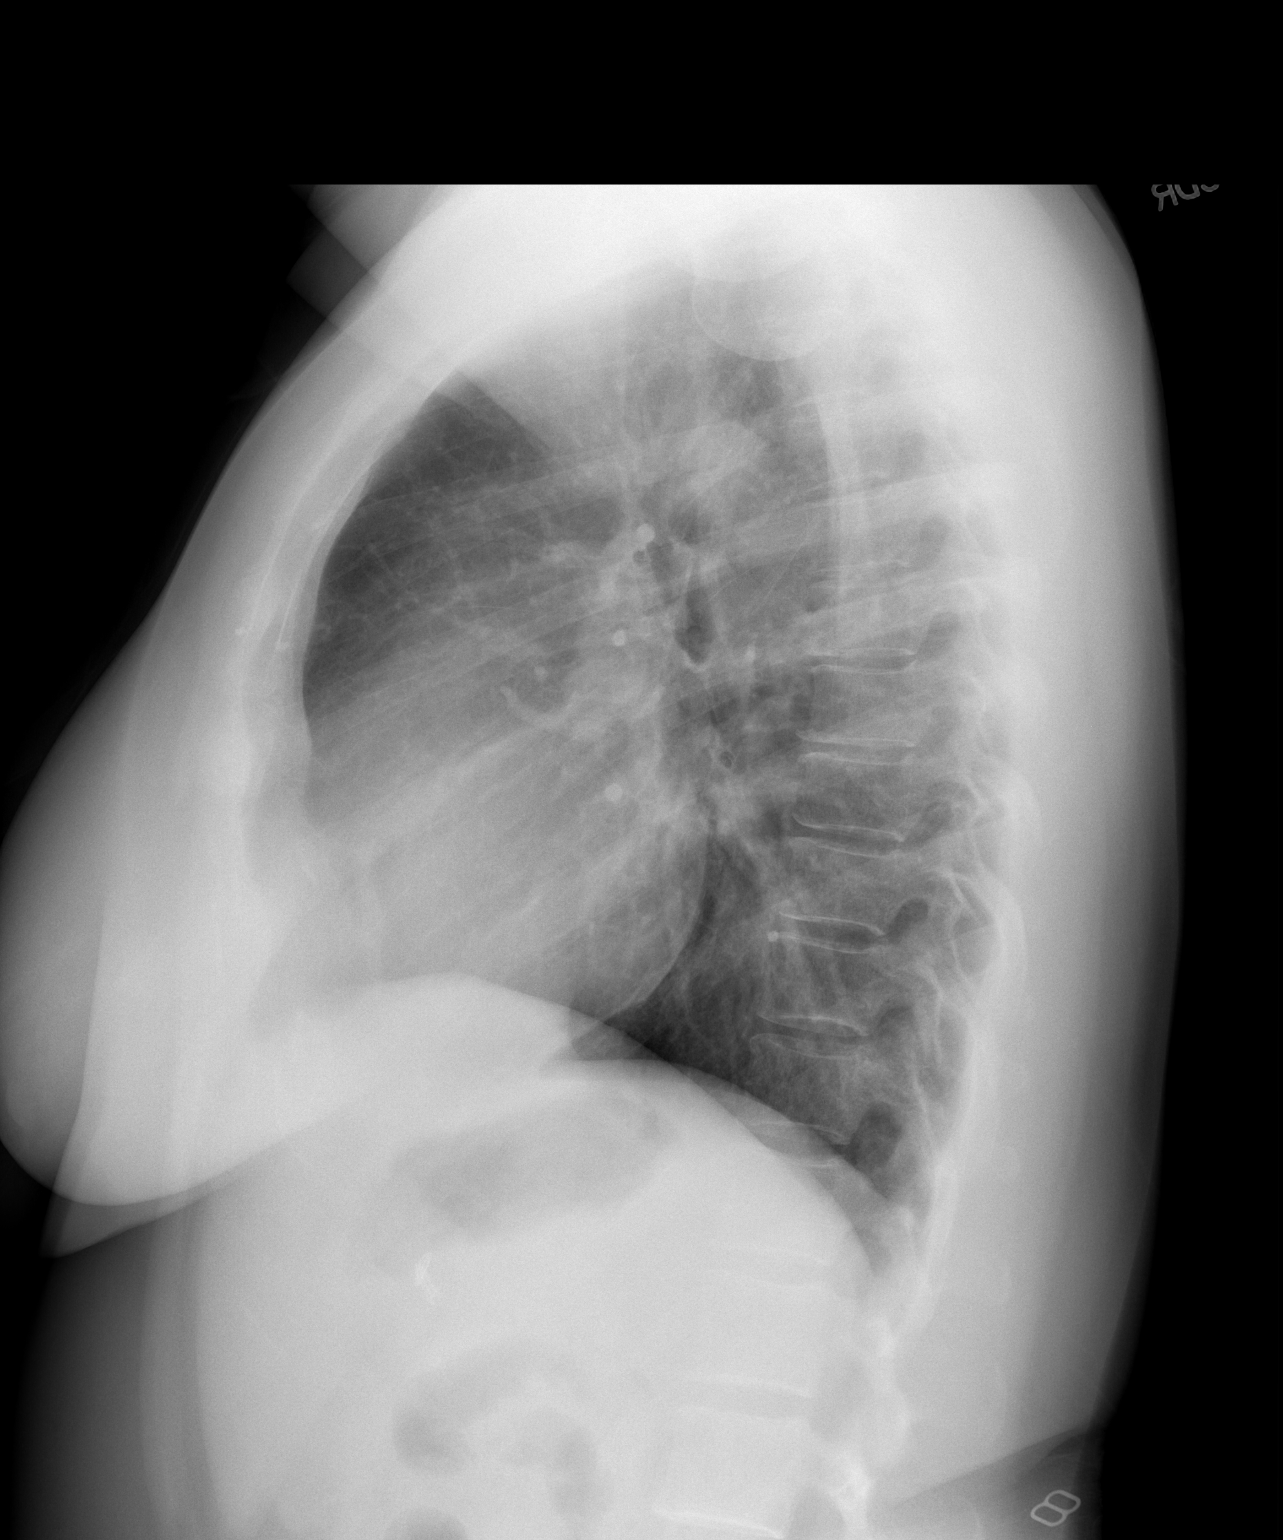

[2 of 2 positions shown; findings below may reference images not displayed]

FINDINGS: The heart size and mediastinal contours are within normal limits.
Both lungs are clear. The visualized skeletal structures are
unremarkable.
IMPRESSION: No radiographic evidence for active cardiopulmonary disease.

## 2019-06-11 ENCOUNTER — Other Ambulatory Visit: Payer: Self-pay | Admitting: Family Medicine

## 2019-06-11 DIAGNOSIS — Z1231 Encounter for screening mammogram for malignant neoplasm of breast: Secondary | ICD-10-CM

## 2020-02-02 ENCOUNTER — Other Ambulatory Visit: Payer: Self-pay

## 2020-02-02 ENCOUNTER — Encounter (HOSPITAL_COMMUNITY): Payer: Self-pay

## 2020-02-02 ENCOUNTER — Emergency Department (HOSPITAL_COMMUNITY)
Admission: EM | Admit: 2020-02-02 | Discharge: 2020-02-02 | Disposition: A | Discharge status: Home / Self Care | Payer: 59 | Attending: Emergency Medicine | Admitting: Emergency Medicine

## 2020-02-02 DIAGNOSIS — Z5321 Procedure and treatment not carried out due to patient leaving prior to being seen by health care provider: Secondary | ICD-10-CM | POA: Diagnosis not present

## 2020-02-02 DIAGNOSIS — R1031 Right lower quadrant pain: Secondary | ICD-10-CM | POA: Diagnosis present

## 2020-02-02 LAB — COMPREHENSIVE METABOLIC PANEL
ALT: 21 U/L (ref 0–44)
AST: 20 U/L (ref 15–41)
Albumin: 4 g/dL (ref 3.5–5.0)
Alkaline Phosphatase: 48 U/L (ref 38–126)
Anion gap: 9 (ref 5–15)
BUN: 14 mg/dL (ref 6–20)
CO2: 24 mmol/L (ref 22–32)
Calcium: 8.9 mg/dL (ref 8.9–10.3)
Chloride: 104 mmol/L (ref 98–111)
Creatinine, Ser: 0.8 mg/dL (ref 0.44–1.00)
GFR calc Af Amer: >60 mL/min (ref >60)
GFR calc non Af Amer: >60 mL/min (ref >60)
Glucose, Bld: 91 mg/dL (ref 70–99)
Potassium: 3.8 mmol/L (ref 3.5–5.1)
Sodium: 137 mmol/L (ref 135–145)
Total Bilirubin: 0.7 mg/dL (ref 0.3–1.2)
Total Protein: 6.5 g/dL (ref 6.5–8.1)

## 2020-02-02 LAB — CBC
HCT: 41.9 % (ref 36.0–46.0)
Hemoglobin: 13.7 g/dL (ref 12.0–15.0)
MCH: 31.4 pg (ref 26.0–34.0)
MCHC: 32.7 g/dL (ref 30.0–36.0)
MCV: 96.1 fL (ref 80.0–100.0)
Platelets: 277 10*3/uL (ref 150–400)
RBC: 4.36 MIL/uL (ref 3.87–5.11)
RDW: 11.9 % (ref 11.5–15.5)
WBC: 6.3 10*3/uL (ref 4.0–10.5)
nRBC: 0 % (ref 0.0–0.2)

## 2020-02-02 LAB — LIPASE, BLOOD: Lipase: 32 U/L (ref 11–51)

## 2020-02-02 LAB — I-STAT BETA HCG BLOOD, ED (MC, WL, AP ONLY): I-stat hCG, quantitative: <5 m[IU]/mL (ref <5)

## 2020-02-02 NOTE — ED Notes (Signed)
Pt left the ED at 18:44 pm. No longer wanted to wait.

## 2020-02-02 NOTE — ED Triage Notes (Signed)
Pt presents with RLQ pain starting this am, pt reports recent blood work and unplanned weight loss of 42 lbs in the last 18 months.

## 2023-07-04 ENCOUNTER — Emergency Department (HOSPITAL_BASED_OUTPATIENT_CLINIC_OR_DEPARTMENT_OTHER)

## 2023-07-04 ENCOUNTER — Encounter (HOSPITAL_BASED_OUTPATIENT_CLINIC_OR_DEPARTMENT_OTHER): Payer: Self-pay | Admitting: Emergency Medicine

## 2023-07-04 ENCOUNTER — Emergency Department (HOSPITAL_BASED_OUTPATIENT_CLINIC_OR_DEPARTMENT_OTHER)
Admission: EM | Admit: 2023-07-04 | Discharge: 2023-07-04 | Disposition: A | Discharge status: Home / Self Care | Attending: Emergency Medicine | Admitting: Emergency Medicine

## 2023-07-04 DIAGNOSIS — R1032 Left lower quadrant pain: Secondary | ICD-10-CM | POA: Diagnosis present

## 2023-07-04 DIAGNOSIS — K5732 Diverticulitis of large intestine without perforation or abscess without bleeding: Secondary | ICD-10-CM | POA: Diagnosis not present

## 2023-07-04 DIAGNOSIS — N83202 Unspecified ovarian cyst, left side: Secondary | ICD-10-CM | POA: Diagnosis not present

## 2023-07-04 DIAGNOSIS — K5792 Diverticulitis of intestine, part unspecified, without perforation or abscess without bleeding: Secondary | ICD-10-CM

## 2023-07-04 LAB — COMPREHENSIVE METABOLIC PANEL
ALT: 107 U/L — ABNORMAL HIGH (ref 0–44)
AST: 59 U/L — ABNORMAL HIGH (ref 15–41)
Albumin: 3.9 g/dL (ref 3.5–5.0)
Alkaline Phosphatase: 117 U/L (ref 38–126)
Anion gap: 8 (ref 5–15)
BUN: 14 mg/dL (ref 6–20)
CO2: 26 mmol/L (ref 22–32)
Calcium: 8.9 mg/dL (ref 8.9–10.3)
Chloride: 104 mmol/L (ref 98–111)
Creatinine, Ser: 0.75 mg/dL (ref 0.44–1.00)
GFR, Estimated: >60 mL/min (ref >60)
Glucose, Bld: 83 mg/dL (ref 70–99)
Potassium: 3.8 mmol/L (ref 3.5–5.1)
Sodium: 138 mmol/L (ref 135–145)
Total Bilirubin: 0.5 mg/dL (ref 0.0–1.2)
Total Protein: 7.4 g/dL (ref 6.5–8.1)

## 2023-07-04 LAB — CBC
HCT: 40.3 % (ref 36.0–46.0)
Hemoglobin: 13.5 g/dL (ref 12.0–15.0)
MCH: 31.3 pg (ref 26.0–34.0)
MCHC: 33.5 g/dL (ref 30.0–36.0)
MCV: 93.5 fL (ref 80.0–100.0)
Platelets: 285 10*3/uL (ref 150–400)
RBC: 4.31 MIL/uL (ref 3.87–5.11)
RDW: 12 % (ref 11.5–15.5)
WBC: 5 10*3/uL (ref 4.0–10.5)
nRBC: 0 % (ref 0.0–0.2)

## 2023-07-04 LAB — URINALYSIS, ROUTINE W REFLEX MICROSCOPIC
Bilirubin Urine: NEGATIVE
Glucose, UA: NEGATIVE mg/dL
Hgb urine dipstick: NEGATIVE
Ketones, ur: NEGATIVE mg/dL
Leukocytes,Ua: NEGATIVE
Nitrite: NEGATIVE
Protein, ur: NEGATIVE mg/dL
Specific Gravity, Urine: 1.025 (ref 1.005–1.030)
pH: 5.5 (ref 5.0–8.0)

## 2023-07-04 LAB — PREGNANCY, URINE: Preg Test, Ur: NEGATIVE

## 2023-07-04 MED ORDER — CIPROFLOXACIN HCL 500 MG PO TABS: 500.0000 mg | ORAL_TABLET | Freq: Two times a day (BID) | ORAL | 0 refills | Status: AC

## 2023-07-04 MED ORDER — IOHEXOL 300 MG/ML  SOLN
100.0000 mL | Freq: Once | INTRAMUSCULAR | Status: AC | PRN
  Administered 2023-07-04: 100 mL via INTRAVENOUS

## 2023-07-04 MED ORDER — FLUCONAZOLE 150 MG PO TABS: 150.0000 mg | ORAL_TABLET | Freq: Once | ORAL | 0 refills | Status: AC

## 2023-07-04 MED ORDER — ONDANSETRON 4 MG PO TBDP: 4.0000 mg | ORAL_TABLET | Freq: Three times a day (TID) | ORAL | 0 refills | Status: AC | PRN

## 2023-07-04 MED ORDER — CIPROFLOXACIN HCL 500 MG PO TABS
500.0000 mg | ORAL_TABLET | Freq: Once | ORAL | Status: AC
  Administered 2023-07-04: 500 mg via ORAL
  Filled 2023-07-04: qty 1

## 2023-07-04 MED ORDER — HYDROCODONE-ACETAMINOPHEN 5-325 MG PO TABS
1.0000 | ORAL_TABLET | Freq: Four times a day (QID) | ORAL | Status: DC | PRN
  Administered 2023-07-04: 1 via ORAL
  Filled 2023-07-04: qty 1

## 2023-07-04 MED ORDER — METRONIDAZOLE 500 MG PO TABS: 500.0000 mg | ORAL_TABLET | Freq: Three times a day (TID) | ORAL | 0 refills | Status: AC

## 2023-07-04 MED ORDER — ONDANSETRON 4 MG PO TBDP: 8.0000 mg | ORAL_TABLET | Freq: Three times a day (TID) | ORAL | Status: DC | PRN

## 2023-07-04 MED ORDER — METRONIDAZOLE 500 MG PO TABS
500.0000 mg | ORAL_TABLET | Freq: Once | ORAL | Status: AC
  Administered 2023-07-04: 500 mg via ORAL
  Filled 2023-07-04: qty 1

## 2023-07-04 NOTE — ED Triage Notes (Signed)
 C/o "extremely heavy period" over the last couple of days. States lower left sided abd/groin pain. States "sharp ovary pain".

## 2023-07-04 NOTE — ED Provider Notes (Signed)
 [place] [facility]GENCY DEPARTMENT AT [facility] Provider Note   CSN: [id number] Arrival date & time: 07/04/23  1222     History  Chief Complaint  Patient presents with   Vaginal Bleeding    Kristin Guzman is a 48 y.o. female.   Vaginal Bleeding   48 year old female presents emergency department complaints of left lower quadrant abdominal pain.  States that she has had pain for the past couple of days.  States the pain is worsened whenever she bears down or "passes gas/stool."  States that she did just recently get off her menstrual cycle 3 to 4 days ago.  States that she been having more heavy periods recently and is having some vaginal spotting currently.  Denies any fever, chills, nausea, vomiting, urinary symptoms, change in bowel habits.  Reports history of cholecystectomy but no other abdominal surgeries.  Past medical history significant for anxiety, depression  Home Medications Prior to Admission medications   Medication Sig Start Date End Date Taking? Authorizing Provider  ALPRAZolam (XANAX) 0.25 MG tablet Take 1 tablet (0.25 mg total) by mouth 2 (two) times daily as needed for anxiety. 06/25/17   Mackuen, Courteney Lyn, MD  ALPRAZolam (XANAX) 0.5 MG tablet Take 0.5 mg by mouth 2 (two) times daily as needed for anxiety.    [provider]  benzonatate (TESSALON) 100 MG capsule Take 1 capsule (100 mg total) by mouth every 8 (eight) hours. 06/17/16   Palumbo, April, MD  cetirizine-pseudoephedrine (ZYRTEC-D ALLERGY & CONGESTION) 5-120 MG tablet Take 1 tablet by mouth 2 (two) times daily. 06/17/16   Palumbo, April, MD  clindamycin (CLEOCIN) 300 MG capsule Take 1 capsule (300 mg total) by mouth 3 (three) times daily. 10/01/16   Liberty Handy, PA-C  clotrimazole (GYNE-LOTRIMIN) 1 % vaginal cream Place 1 Applicatorful vaginally at bedtime. 10/01/16   Liberty Handy, PA-C  fluticasone (FLONASE) 50 MCG/ACT nasal spray Place 2 sprays into both nostrils daily. 06/17/16    Palumbo, April, MD  Hydrocodone-Acetaminophen (LORTAB PO) Take by mouth.    [provider]  ibuprofen (ADVIL,MOTRIN) 400 MG tablet Take 400 mg by mouth every 6 (six) hours as needed.    [provider]      Allergies    Augmentin [amoxicillin-pot clavulanate]    Review of Systems   Review of Systems  Genitourinary:  Positive for vaginal bleeding.  All other systems reviewed and are negative.   Physical Exam Updated Vital Signs BP (!) 143/79 (BP Location: Left Arm)   Pulse 82   Temp 98 F (36.7 C)   Resp 18   Ht 4\' 11"  (1.499 m)   Wt 61.2 kg   SpO2 97%   BMI 27.27 kg/m  Physical Exam Vitals and nursing note reviewed.  Constitutional:      General: She is not in acute distress.    Appearance: She is well-developed.  HENT:     Head: Normocephalic and atraumatic.  Eyes:     Conjunctiva/sclera: Conjunctivae normal.  Cardiovascular:     Rate and Rhythm: Normal rate and regular rhythm.     Heart sounds: No murmur heard. Pulmonary:     Effort: Pulmonary effort is normal. No respiratory distress.     Breath sounds: Normal breath sounds.  Abdominal:     Palpations: Abdomen is soft.     Tenderness: There is abdominal tenderness in the left lower quadrant. There is no right CVA tenderness, left CVA tenderness, guarding or rebound.  Musculoskeletal:  General: No swelling.     Cervical back: Neck supple.  Skin:    General: Skin is warm and dry.     Capillary Refill: Capillary refill takes less than 2 seconds.  Neurological:     Mental Status: She is alert.  Psychiatric:        Mood and Affect: Mood normal.     ED Results / Procedures / Treatments   Labs (all labs ordered are listed, but only abnormal results are displayed) Labs Reviewed  CBC  PREGNANCY, URINE  URINALYSIS, ROUTINE W REFLEX MICROSCOPIC  COMPREHENSIVE METABOLIC PANEL    EKG None  Radiology No results found.  Procedures Procedures    Medications Ordered in  ED Medications - No data to display  ED Course/ Medical Decision Making/ A&P                                 Medical Decision Making Amount and/or Complexity of Data Reviewed Labs: ordered. Radiology: ordered.  Risk Prescription drug management.   This patient presents to the ED for concern of abdominal pain, this involves an extensive number of treatment options, and is a complaint that carries with it a high risk of complications and morbidity.  The differential diagnosis includes gastritis, PUD, cholecystitis, CBD pathology, SBO/LBO, volvulus, diverticulitis, appendicitis, ovarian torsion, ovarian cyst, ectopic pregnancy, other   Co morbidities that complicate the patient evaluation  See HPI   Additional history obtained:  Additional history obtained from EMR External records from outside source obtained and reviewed including hospital records   Lab Tests:  I Ordered, and personally interpreted labs.  The pertinent results include: No electrolyte abnormalities.  No renal dysfunction.  Transaminitis AST 59, ALT 107.  No leukocytosis.  No evidence of anemia.  Placed within range.  UA without abnormality.  Urine pregnancy negative.   Imaging Studies ordered:  I ordered imaging studies including CT abdomen pelvis, pelvic ultrasound I independently visualized and interpreted imaging which showed  CT abdomen pelvis: Moderate to moderate severity sigmoid diverticulitis.  2.4 simple left adnexal cyst. Pelvic ultrasound: Septated left ovarian cyst I agree with the radiologist interpretation  Cardiac Monitoring: / EKG:  The patient was maintained on a cardiac monitor.  I personally viewed and interpreted the cardiac monitored which showed an underlying rhythm of: Sinus rhythm   Consultations Obtained:  N/a   Problem List / ED Course / Critical interventions / Medication management  Left lower quadrant abdominal pain I ordered medication including Cipro, Flagyl, Norco    Reevaluation of the patient after these medicines showed that the patient improved I have reviewed the patients home medicines and have made adjustments as needed   Social Determinants of Health:  Denies tobacco, illicit drug use.   Test / Admission - Considered:  Left lower quadrant abdominal pain, diverticulitis, ovarian cyst Vitals signs within normal range and stable throughout visit. Laboratory/imaging studies significant for: See above 48 year old female presents emergency department with complaints of  left lower quadrant abdominal pain.  States that she has had pain for the past couple of days.  States the pain is worsened whenever she bears down or "passes gas/stool."  States that she did just recently get off her menstrual cycle 3 to 4 days ago.  States that she been having more heavy periods recently and is having some vaginal spotting currently.  Denies any fever, chills, nausea, vomiting, urinary symptoms, change in bowel habits.  Reports  history of cholecystectomy but no other abdominal surgeries. On exam, tenderness localized to left lower quadrant of abdomen.  Labs reassuring with a nonspecific slight transaminitis but otherwise without any acute finding.  Imaging studies concerning for left-sided ovarian cyst as well as uncomplicated sigmoid diverticulitis.  Patient without meeting of SIRS criteria so sepsis protocol was not performed.  Place patient on antibiotics as well as recommend dietary changes regarding current diverticulitis.  Regarding left-sided ovarian cyst, no evidence of torsion/abscess or other complicating feature; recommend follow-up with OB/GYN in outpatient setting.  Treatment plan discussed length with patient and she acknowledged understanding was agreeable to said plan.  Patient overall well-appearing, afebrile in no acute distress. Worrisome signs and symptoms were discussed with the patient, and the patient acknowledged understanding to return to the ED if  noticed. Patient was stable upon discharge.          Final Clinical Impression(s) / ED Diagnoses Final diagnoses:  None    Rx / DC Orders ED Discharge Orders     None         Peter Garter, PA 07/04/23 2002    Virgina Norfolk, DO 07/04/23 2228

## 2023-07-04 NOTE — Discharge Instructions (Addendum)
 As discussed, CT imaging did show significant diverticulitis on the left side of your colon.  The placement to give antibiotics for treatment of colitis as well as recommend dietary changes as described in your instruction packet.  We also have a left-sided ovarian cyst that recommend follow-up ultrasound by OB/GYN in the outpatient setting in 6 to 12 weeks.  Please do not hesitate to return to the emergency department if the worrisome signs and symptoms we discussed become apparent.

## 2024-06-30 ENCOUNTER — Ambulatory Visit: Admitting: Physical Therapy
